# Patient Record
Sex: Female | Born: 1975 | Race: White | Hispanic: No | Smoking: Former smoker
Health system: Southern US, Community
[De-identification: ages and names within clinical notes are randomized; demographics above are authoritative.]

## PROBLEM LIST (undated history)

## (undated) DIAGNOSIS — F419 Anxiety disorder, unspecified: Secondary | ICD-10-CM

## (undated) DIAGNOSIS — N289 Disorder of kidney and ureter, unspecified: Secondary | ICD-10-CM

## (undated) DIAGNOSIS — G43909 Migraine, unspecified, not intractable, without status migrainosus: Secondary | ICD-10-CM

## (undated) HISTORY — PX: OTHER SURGICAL HISTORY: SHX169

## (undated) HISTORY — PX: ABDOMINAL HYSTERECTOMY: SHX81

## (undated) HISTORY — PX: CHOLECYSTECTOMY: SHX55

---

## 1998-09-05 ENCOUNTER — Other Ambulatory Visit: Admission: RE | Admit: 1998-09-05 | Discharge: 1998-09-05 | Payer: Self-pay | Admitting: *Deleted

## 1998-11-05 ENCOUNTER — Inpatient Hospital Stay (HOSPITAL_COMMUNITY): Admission: AD | Admit: 1998-11-05 | Discharge: 1998-11-05 | Payer: Self-pay | Admitting: *Deleted

## 1998-11-14 ENCOUNTER — Inpatient Hospital Stay (HOSPITAL_COMMUNITY): Admission: AD | Admit: 1998-11-14 | Discharge: 1998-11-17 | Payer: Self-pay | Admitting: Obstetrics and Gynecology

## 1999-05-09 ENCOUNTER — Emergency Department (HOSPITAL_COMMUNITY): Admission: EM | Admit: 1999-05-09 | Discharge: 1999-05-09 | Payer: Self-pay | Admitting: *Deleted

## 1999-09-11 ENCOUNTER — Other Ambulatory Visit: Admission: RE | Admit: 1999-09-11 | Discharge: 1999-09-11 | Payer: Self-pay | Admitting: *Deleted

## 1999-11-18 ENCOUNTER — Ambulatory Visit (HOSPITAL_BASED_OUTPATIENT_CLINIC_OR_DEPARTMENT_OTHER): Admission: RE | Admit: 1999-11-18 | Discharge: 1999-11-19 | Payer: Self-pay | Admitting: Orthopedic Surgery

## 2000-08-31 ENCOUNTER — Other Ambulatory Visit: Admission: RE | Admit: 2000-08-31 | Discharge: 2000-08-31 | Payer: Self-pay | Admitting: Obstetrics and Gynecology

## 2001-08-12 ENCOUNTER — Other Ambulatory Visit: Admission: RE | Admit: 2001-08-12 | Discharge: 2001-08-12 | Payer: Self-pay | Admitting: Obstetrics and Gynecology

## 2001-09-01 ENCOUNTER — Ambulatory Visit (HOSPITAL_COMMUNITY): Admission: RE | Admit: 2001-09-01 | Discharge: 2001-09-01 | Payer: Self-pay | Admitting: Obstetrics and Gynecology

## 2001-09-01 ENCOUNTER — Encounter: Payer: Self-pay | Admitting: Obstetrics and Gynecology

## 2001-10-12 ENCOUNTER — Encounter (INDEPENDENT_AMBULATORY_CARE_PROVIDER_SITE_OTHER): Payer: Self-pay

## 2001-10-12 ENCOUNTER — Ambulatory Visit (HOSPITAL_COMMUNITY): Admission: RE | Admit: 2001-10-12 | Discharge: 2001-10-12 | Payer: Self-pay | Admitting: Obstetrics and Gynecology

## 2002-09-14 ENCOUNTER — Other Ambulatory Visit: Admission: RE | Admit: 2002-09-14 | Discharge: 2002-09-14 | Payer: Self-pay | Admitting: Obstetrics and Gynecology

## 2002-09-30 ENCOUNTER — Inpatient Hospital Stay (HOSPITAL_COMMUNITY): Admission: AD | Admit: 2002-09-30 | Discharge: 2002-09-30 | Payer: Self-pay | Admitting: Obstetrics and Gynecology

## 2002-11-08 ENCOUNTER — Inpatient Hospital Stay (HOSPITAL_COMMUNITY): Admission: AD | Admit: 2002-11-08 | Discharge: 2002-11-08 | Payer: Self-pay | Admitting: Obstetrics and Gynecology

## 2002-11-23 ENCOUNTER — Encounter: Payer: Self-pay | Admitting: Obstetrics and Gynecology

## 2002-11-23 ENCOUNTER — Ambulatory Visit (HOSPITAL_COMMUNITY): Admission: RE | Admit: 2002-11-23 | Discharge: 2002-11-23 | Payer: Self-pay | Admitting: Obstetrics and Gynecology

## 2002-12-07 ENCOUNTER — Ambulatory Visit (HOSPITAL_COMMUNITY): Admission: RE | Admit: 2002-12-07 | Discharge: 2002-12-07 | Payer: Self-pay | Admitting: Obstetrics and Gynecology

## 2002-12-07 ENCOUNTER — Encounter: Payer: Self-pay | Admitting: Obstetrics and Gynecology

## 2003-03-02 ENCOUNTER — Observation Stay (HOSPITAL_COMMUNITY): Admission: AD | Admit: 2003-03-02 | Discharge: 2003-03-03 | Payer: Self-pay | Admitting: Obstetrics and Gynecology

## 2003-03-02 ENCOUNTER — Encounter: Payer: Self-pay | Admitting: Obstetrics and Gynecology

## 2003-03-20 ENCOUNTER — Inpatient Hospital Stay (HOSPITAL_COMMUNITY): Admission: AD | Admit: 2003-03-20 | Discharge: 2003-03-20 | Payer: Self-pay | Admitting: Obstetrics and Gynecology

## 2003-03-24 ENCOUNTER — Inpatient Hospital Stay (HOSPITAL_COMMUNITY): Admission: AD | Admit: 2003-03-24 | Discharge: 2003-03-24 | Payer: Self-pay | Admitting: Obstetrics and Gynecology

## 2003-04-06 ENCOUNTER — Inpatient Hospital Stay (HOSPITAL_COMMUNITY): Admission: AD | Admit: 2003-04-06 | Discharge: 2003-04-09 | Payer: Self-pay | Admitting: Obstetrics and Gynecology

## 2003-05-29 ENCOUNTER — Ambulatory Visit (HOSPITAL_COMMUNITY): Admission: RE | Admit: 2003-05-29 | Discharge: 2003-05-29 | Payer: Self-pay | Admitting: Obstetrics and Gynecology

## 2003-10-23 ENCOUNTER — Other Ambulatory Visit: Admission: RE | Admit: 2003-10-23 | Discharge: 2003-10-23 | Payer: Self-pay | Admitting: Obstetrics and Gynecology

## 2004-04-12 ENCOUNTER — Ambulatory Visit (HOSPITAL_COMMUNITY): Admission: RE | Admit: 2004-04-12 | Discharge: 2004-04-12 | Payer: Self-pay | Admitting: Obstetrics and Gynecology

## 2004-05-19 ENCOUNTER — Inpatient Hospital Stay (HOSPITAL_COMMUNITY): Admission: EM | Admit: 2004-05-19 | Discharge: 2004-05-21 | Payer: Self-pay | Admitting: Emergency Medicine

## 2004-05-20 ENCOUNTER — Encounter (INDEPENDENT_AMBULATORY_CARE_PROVIDER_SITE_OTHER): Payer: Self-pay | Admitting: *Deleted

## 2004-12-17 ENCOUNTER — Other Ambulatory Visit: Admission: RE | Admit: 2004-12-17 | Discharge: 2004-12-17 | Payer: Self-pay | Admitting: Obstetrics and Gynecology

## 2004-12-25 ENCOUNTER — Ambulatory Visit (HOSPITAL_COMMUNITY): Admission: RE | Admit: 2004-12-25 | Discharge: 2004-12-25 | Payer: Self-pay | Admitting: Obstetrics and Gynecology

## 2005-01-06 ENCOUNTER — Ambulatory Visit (HOSPITAL_COMMUNITY): Admission: RE | Admit: 2005-01-06 | Discharge: 2005-01-06 | Payer: Self-pay | Admitting: Obstetrics and Gynecology

## 2005-03-05 ENCOUNTER — Inpatient Hospital Stay (HOSPITAL_COMMUNITY): Admission: RE | Admit: 2005-03-05 | Discharge: 2005-03-08 | Payer: Self-pay | Admitting: Obstetrics and Gynecology

## 2005-03-05 ENCOUNTER — Encounter (INDEPENDENT_AMBULATORY_CARE_PROVIDER_SITE_OTHER): Payer: Self-pay | Admitting: Specialist

## 2007-07-10 ENCOUNTER — Emergency Department (HOSPITAL_COMMUNITY): Admission: EM | Admit: 2007-07-10 | Discharge: 2007-07-10 | Payer: Self-pay | Admitting: Family Medicine

## 2007-07-12 ENCOUNTER — Emergency Department (HOSPITAL_COMMUNITY): Admission: EM | Admit: 2007-07-12 | Discharge: 2007-07-12 | Payer: Self-pay | Admitting: Emergency Medicine

## 2008-08-30 ENCOUNTER — Emergency Department (HOSPITAL_COMMUNITY): Admission: EM | Admit: 2008-08-30 | Discharge: 2008-08-30 | Payer: Self-pay | Admitting: Family Medicine

## 2010-08-04 ENCOUNTER — Encounter: Payer: Self-pay | Admitting: Obstetrics and Gynecology

## 2010-11-29 NOTE — Discharge Summary (Signed)
   NAME:  [name], NAME                    ACCOUNT NO.:  [id number]   MEDICAL RECORD NO.:  [id number]                   PATIENT TYPE:  INP   LOCATION:  9133                                 FACILITY:  [facility]   PHYSICIAN:  Janine Limbo, M.D.            DATE OF BIRTH:  September 22, 1975   DATE OF ADMISSION:  04/06/2003  DATE OF DISCHARGE:  04/09/2003                                 DISCHARGE SUMMARY   ADMISSION DIAGNOSES:  1. Intrauterine pregnancy at term.  2. Early labor.  3. Fetal tachycardia.   PROCEDURE:  Primary low transverse cesarean section.   DISCHARGE DIAGNOSES:  1. Intrauterine pregnancy at term.  2. Early labor.  3. Fetal tachycardia.  4. Failure to descend and persistent OP.   HISTORY OF PRESENT ILLNESS:  [name] is a 35 year old gravida 2, para  1-0-0-1 who presents at 38-3/7 weeks in early labor. Her labor was augmented  with Pitocin. She progressed to complete dilation and then began pushing.  She pushed for 2 hours without change in station beyond a +2. Vertex with  persistent posterior position. The patient declined any operative vaginal  intervention. Therefore, in light of patient's 2 hours of pushing without  progress, fetal heart rate changes, and posterior position, cesarean section  was recommended by Dr. Dierdre Forth and accepted by the patient. She  underwent primary low transverse cesarean section on April 06, 2003.  Time of delivery 2315 by Dr. Dierdre Forth with the birth of an 8 pound 11  ounce female named Sager with Apgar scores of 8 at one minute and 9 at five  minutes. Both mother and infant have been stable in the postoperative period  The patient's hemoglobin on the first postoperative day was 9.6. Her vital  signs have remained stable. Her incision is clean and intact. She has a  Jackson-Pratt drain that has initially drained approximately 50 cc per day  and has gradually decreased to minimal drainage, less than 5 cc at the  present time. She is deemed to be in satisfactory condition for discharge on  this, her third postoperative day.   DISCHARGE INSTRUCTIONS:  Per [facility] handout.   DISCHARGE MEDICATIONS:  1. Motrin 600 mg p.o. q. 6 hours p.r.n. pain.  2. Tylox 1-2 p.o. q. 3-4 hours pain.  3. Ortho Tri-Cyclen for contraception.  4. Prenatal vitamins.   FOLLOW UP:  At [facility] and Gynecologic Services in 6  weeks.    Rica Koyanagi, C.N.M.               Janine Limbo, M.D.   SDM/MEDQ  D:  04/09/2003  T:  04/09/2003  Job:  [id number]

## 2010-11-29 NOTE — Discharge Summary (Signed)
NAMEDARRAH, DREDGE NO.:  [id number]   MEDICAL RECORD NO.:  [id number]          PATIENT TYPE:  INP   LOCATION:  0380                         FACILITY[facility]  [facility]   PHYSICIAN:  Leonie Man, M.D.   DATE OF BIRTH:  Apr 24, 1976   DATE OF ADMISSION:  05/19/2004  DATE OF DISCHARGE:  05/21/2004                                 DISCHARGE SUMMARY   ADMISSION DIAGNOSES:  Subacute cholecystitis and biliary colic.   DISCHARGE DIAGNOSES:  Subacute cholecystitis and biliary colic.   PROCEDURE:  Laparoscopic cholecystectomy with intraoperative cholangiogram.   COMPLICATIONS:  None.   CONDITION ON DISCHARGE:  Improved.   SUMMARY:  Ms. Irigoyen is a 35 year old female who presents with recurrent  episodes of epigastric and right upper quadrant pain associated with nausea  and vomiting. CT and ultrasound scanning showed cholelithiasis. The patient  was scheduled for surgery in December but entered the hospital acutely on  the day of admission because of severe unrelenting abdominal pain and nausea  and vomiting. After admission and pain control, the patient was started on  antibiotics and then taken to the operating room on May 20, 2004 where  she underwent laparoscopic cholecystectomy. Her postoperative course has  been benign with the resumption of diet and her usual activity. At the time  of discharge, she is afebrile and tolerating her usual diet. She is not  having any diarrhea, and her wounds are healing satisfactorily. She will be  followed up in the office in approximately two to three weeks.   DISCHARGE MEDICATIONS:  Vicodin 1 to 2 every 4 hours p.r.n.   ACTIVITY:  As tolerated.   DIET:  Unrestricted. d     Patr   PB/MEDQ  D:  05/21/2004  T:  05/22/2004  Job:  [id number]

## 2010-11-29 NOTE — Discharge Summary (Signed)
NAMEDAILY, DOE NO.:  [id number]   MEDICAL RECORD NO.:  [id number]          PATIENT TYPE:  INP   LOCATION:  9314                          FACILITY:  [facility]   PHYSICIAN:  James A. Ashley Royalty, M.D.DATE OF BIRTH:  07-17-1975   DATE OF ADMISSION:  03/05/2005  DATE OF DISCHARGE:  03/08/2005                                 DISCHARGE SUMMARY   DISCHARGE DIAGNOSES:  1.  Adenomyosis.  2.  Endometriosis.  3.  Left lower quadrant discomfort, probably secondary to #2.   OPERATIONS AND SPECIAL PROCEDURES:  Total abdominal hysterectomy, left  salpingo-oophorectomy.   CONSULTATIONS:  None.   DISCHARGE MEDICATIONS:  1.  Demerol 50 mg.  2.  Motrin 600 mg.   HISTORY AND PHYSICAL:  This is a 35 year old gravida 2, para 2, with known  history of endometriosis, severe dysmenorrhea, and cyclic left lower  quadrant discomfort.  She was admitted for total abdominal hysterectomy with  possible unilateral salpingo-oophorectomy and indicated procedures.  For the  remainder of the History and Physical, please see chart.   HOSPITAL COURSE:  The patient was admitted to [facility]  [place].  Admission laboratory studies were drawn.  On August 23, she was  taken to the operating room and underwent total abdominal hysterectomy with  salpingo-oophorectomy.  The procedure was uncomplicated.  Her postoperative  course was similarly uncomplicated.  She was discharged on the third  postoperative day afebrile and in satisfactory condition.   DISPOSITION:  She will return to [facility] and Obstetrics in 4 to  6 weeks for postoperative evaluation.      James A. Ashley Royalty, M.D.  Electronically Signed     JAM/MEDQ  D:  05/07/2005  T:  05/07/2005  Job:  [id number]

## 2010-11-29 NOTE — H&P (Signed)
[name], [name] NO.:  [id number]   MEDICAL RECORD NO.:  [id number]          PATIENT TYPE:  INP   LOCATION:  0102                         FACILITY:  [facility]   PHYSICIAN:  Abigail Miyamoto, M.D. DATE OF BIRTH:  [birth year]   DATE OF ADMISSION:  05/19/2004  DATE OF DISCHARGE:                                HISTORY & PHYSICAL   CHIEF COMPLAINT:  Abdominal pain, nausea, and vomiting.   HISTORY:  This is a 35 year old female with a known history of gallstones  and symptomatic cholelithiasis, who developed abdominal pain, nausea, and  vomiting today.  This was followed by diarrhea.  This was similar to her  previous attacks of symptomatic cholelithiasis.  She has been seen by Dr.  Leonie Man in our office and was originally scheduled to have a  laparoscopic cholecystectomy this December.  She reports her pain is sharp  and constant.   PAST MEDICAL HISTORY:  Significant for endometriosis.  She has had multiple  surgical procedures and laparoscopy secondary to this.  She has also had a  bilateral tubal ligation.   FAMILY HISTORY:  Negative.   SOCIAL HISTORY:  Negative for alcohol and drug abuse.  It is negative for  smoking.   ALLERGIES:  1.  AMOXICILLIN.  2.  SULFA.   MEDICATIONS:  None.   REVIEW OF SYSTEMS:  Negative for chest pain, dysuria, or shortness of  breath, but is otherwise unremarkable.   PHYSICAL EXAMINATION:  VITAL SIGNS:  Temperature of 98.4, pulse 78,  respiratory rate 18, blood pressure 120/90.  GENERAL:  She is ill and uncomfortable in appearance.  HEENT:  Eyes - she is anicteric.  Pupils were reactive bilaterally.  Ears,  nose, mouth, and throat - her ears and nose are normal.  Hearing is normal.  Oropharynx is clear.  NECK:  Supple with no cervical adenopathy.  There is no thyromegaly.  LUNGS:  Clear to auscultation bilaterally.  Normal respiratory effort.  CARDIOVASCULAR:  Regular rate and rhythm.  There are no murmurs.  There is  no peripheral edema.  ABDOMEN:  Soft.  There is tenderness with guarding of the right upper  quadrant and epigastrium.  There is a well-healed incision at the umbilicus.  There are no hernias.  There is no organomegaly.  EXTREMITIES:  Warm and well perfused.  There is no edema, clubbing, or  cyanosis.  NEUROLOGIC:  Nonfocal.   LABORATORY DATA:  The patient has a CBC and a CMET pending.  Beta hCG is  negative.   IMPRESSION:  This is a patient with symptomatic cholelithiasis.   PLAN:  At this point, the plan will be to admit the patient, check her liver  function tests and white blood count, start her on IV rehydration and  antibiotics, and plan laparoscopic cholecystectomy this admission.  I will  notify Dr. Lurene Shadow in the morning that [name] has been placed in the  hospital.      DB/MEDQ  D:  05/19/2004  T:  05/20/2004  Job:  [id number]

## 2010-11-29 NOTE — H&P (Signed)
NAME:  [name], [name]                    ACCOUNT NO.:  [id number]   MEDICAL RECORD NO.:  [id number]                   PATIENT TYPE:  INP   LOCATION:  9374                                 FACILITY:  [facility]   PHYSICIAN:  Crist Fat. Rivard, M.D.              DATE OF BIRTH:  [birth year]   DATE OF ADMISSION:  03/02/2003  DATE OF DISCHARGE:                                HISTORY & PHYSICAL   [name] is a 35 year old gravida 2, para 1-0-0-1, at 33-3/7 weeks,  who presented from the office for evaluation of midsternal chest pain and  shortness of breath.  She had onset of these symptoms yesterday with initial  shortness of breath, then worsened through the night, progressing to chest  pain today.  She also has a dry and hacking cough.  She denies nausea,  vomiting, cramping, dysuria, or diarrhea.  She has no food association with  these symptoms, no previous history of  these symptoms, no hemoptysis.  The  patient is a nonsmoker.  The patient was evaluated in maternity admissions  with 2 D echo, cardiac enzymes, continuous fetal monitoring, nitroglycerin,  and initial one dose of aspirin.  The patient's shortness of breath symptoms  did persist although the chest pain essentially resolved.  [name],  M.D., at [facility] was consulted.  He recommended the patient be  admitted for 23-hour observation to include a chest x-ray and a repeat of  cardiac enzymes. The patient is therefore admitted to the antenatal unit for  observation.  Pregnancy has been remarkable for:   1. Obesity with current weight 265 pounds, which is the same as her last     visit one week ago.  2. First trimester bleeding.  3. History of preeclampsia.  4. History of migraines.  5. Endometriosis.  6. Medication allergies including DARVOCET, SULFA, and AMOXICILLIN.    PRENATAL LABORATORY DATA:  Blood type is O positive, Rh antibody negative.  VDRL nonreactive.  Rubella titer positive.  Hepatitis B  surface antigen  negative.  Pap was normal.  Glucose challenge was normal x2.  AFP was  normal.  Hemoglobin upon entering the practice was 12.1.  It was within  normal limits at 28 weeks.  The patient had a negative UA today.  CBC today  showed hemoglobin of 11.9, hematocrit of 34.1, white blood cell count of  8.6, and platelets of 210.  The patient also had cardiac enzymes of CK-MB  equal to 40, CK of 1.0, and troponin I of 0.02.  Comprehensive metabolic  panel was within normal limits.  EDC of April 17, 2003, was established by  ultrasound which was done at six weeks.   HISTORY OF PRESENT PREGNANCY:  The patient entered care at approximately six  weeks.  She had fallen at work.  She had an ultrasound around that time for  dating purposes.  She also had a little bit of first trimester bleeding.  She had some side pain in the early part of her pregnancy.  She did have  migraines in the early part of her pregnancy as well, which were treated  with Tylenol No. 3.  She had evaluation in maternity admissions in March for  pain.  She also had a GI virus at 17 weeks and received fluid.  She had an  ultrasound at 26 weeks that showed greater than 75th to 90th percentile.  The rest of her pregnancy was essentially uncomplicated until she had the  onset of chest pain and shortness of breath yesterday.   OBSTETRICAL HISTORY:  In May 2000 she had a vaginal birth of a female infant  that weighed 6 pounds 11 ounces at 39 weeks.  She was in labor five hours.  She was induced secondary to preeclampsia.  She had a vacuum-assisted  vaginal birth.  During that pregnancy she also had anemia.   PAST MEDICAL HISTORY:  She was on Ortho Tri-Cyclen until October 2003.  She  had a diagnostic laparoscopy done in April 2003 by Hal Morales, M.D.,  with endometriosis found on the left ovary and at the cervix.  She reports  the usual childhood illnesses.  She has had questionable irritable bowel  syndrome  symptoms in the past but no true diagnosis.  She has had cystitis  x1.  She has had a history of migraine headaches.   She is sensitive to DARVOCET, which causes nausea and vomiting; SULFA, which  causes hives; and AMOXICILLIN, which causes rash and hives.   She does have a history of irregular cycles.   FAMILY HISTORY:  Her maternal grandmother had heart disease.  Her father has  hypertension.  Her maternal grandmother has diabetes.  Her uncle had renal  disease and was on dialysis.  She does have a family history of breast  cancer.  Paternal grandfather had a stroke.  Paternal grandmother and  paternal grandfather had Alzheimer's.  Her father is a smoker.   PAST SURGICAL HISTORY:  Right ankle surgery in 2001.  She had a diagnostic  laparoscopy with Dr. Pennie Rushing in April 2003.  She did have nausea and  vomiting after ankle surgery.   GENETIC HISTORY:  Remarkable for the patient's family having club foot that  runs in the family.   SOCIAL HISTORY:  The patient is married to the father of the baby.  He is  involved and supportive. His name is Ercilia Bettinger, goes by [employer].  The patient is Caucasian, of the [employer].  She has been followed by  the physician service at [facility].  She denies any alcohol, drug,  or tobacco use during this pregnancy.   PHYSICAL EXAMINATION:  VITAL SIGNS:  Blood pressure is 124/78, pulse is 97-  119, O2 saturations are 95-99% on room air.  The patient is afebrile.  The  patient's weight today was 265 pounds, which was the same as it was on her  last visit.  HEENT:  Within normal limits.  CHEST:  Bilateral breath sounds are clear.  Respiratory rate is 22.  CARDIAC:  No murmur is noted.  ABDOMEN:  Fundal height is approximately 37 cm. Fetus is vertex by  Leopold's.  Abdomen is soft and nontender and is obese.  Fetal monitoring  shows fetal heart rate has been reactive throughout her maternity admissions time with no decelerations.  No  uterine contractions are noted.  PELVIC:  Deferred.  EXTREMITIES:  Deep tendon reflexes are 1+ without  clonus, with a trace edema  noted.   EKG showed sinus tachycardia.  The comment was:  Cannot rule out anterior  infarct, age indeterminate.  Possible inferior infarct, age indeterminate.   ASSESSMENT:  1. Intrauterine pregnancy at 33-3/7 weeks.  2. Chest pain, shortness of breath, with EKG changes.   PLAN:  1. The patient will be admitted to antenatal unit or AICU for 23-hour     observation.  2. Continuous electronic fetal monitoring.  3. Chest x-ray.  4. Repeat cardiac enzyme panel including CK, CK-MB, and troponin I will be     repeated eight hours after the initial labs.  5. Dr. Myrtis Ser will be in to see the patient at some point during her     hospitalization.  6. Dr. Estanislado Pandy has recommended Protonix 40 mg one p.o. daily.  7.     O2 per nasal cannula at 2 L/min. will be given for patient comfort.  8. Head of bed will be elevated for patient comfort.  9. Dr. Estanislado Pandy and Dr. Myrtis Ser will follow.     Renaldo Reel Emilee Hero, C.N.M.                   Crist Fat Rivard, M.D.    Leeanne Mannan  D:  03/02/2003  T:  03/03/2003  Job:  [id number]

## 2010-11-29 NOTE — Op Note (Signed)
[name], SLINGER NO.:  [id number]   MEDICAL RECORD NO.:  [id number]          PATIENT TYPE:  AMB   LOCATION:  SDC                           FACILITY:  [facility]   PHYSICIAN:  James A. Ashley Royalty, M.D.DATE OF BIRTH:  [birth year]   DATE OF PROCEDURE:  01/06/2005  DATE OF DISCHARGE:                                 OPERATIVE REPORT   PREOPERATIVE DIAGNOSIS:  1.  Endometriosis.  2.  Left lower quadrant pain - differential includes endometriosis,      adhesions, primary GI, musculoskeletal.   POSTOPERATIVE DIAGNOSIS:  No evidence of endometriosis or adhesions.   PROCEDURE:  Diagnostic laparoscopy.   SURGEON:  Rudy Jew. Ashley Royalty, M.D.   ANESTHESIA:  General.   ESTIMATED BLOOD LOSS:  Less than 25 mL.   COMPLICATIONS:  None.   PACKS AND DRAINS:  None.   PROCEDURE:  The patient was taken to operating room, placed in dorsosupine  position. After adequate general endotracheal anesthesia was administered,  she was placed in the lithotomy position, prepped and draped in usual manner  for abdominal and vaginal surgery. Posterior weighted retractor was placed  per vagina. The anterior lip of the cervix was grasped with single-tooth  tenaculum. Jarcho uterine manipulator was placed per cervix and held in  place with tenaculum. Bladder was drained with red rubber catheter. The 1.5  cm infraumbilical incision was made in a longitudinal plane. Veress needle  was inserted into the abdominal cavity. It's location verified by  instillation saline in hanging drop techniques. Approximately 3 liters CO2  were instilled at 1 liter per minute to create the pneumoperitoneum. Next a  size 11 disposable laparoscopic trocar was placed into the abdominal cavity.  It's location was visualized by placement of laparoscope. There is no  evidence of any trauma. The pelvis was then thoroughly surveyed. The uterus  was normal size, shape and contour without evidence of any fibroids or  endometriosis. The fallopian tubes were normal size, shape and contour  except for bilateral interruption consistent with previous tubal  sterilization procedure. Left ovary was normal size, shape and contour  without evidence of any cysts or endometriosis. Right ovary was normal size,  shape and contour with approximately 1.5 cm cystic follicle present on it.  There was no evidence of any overt cysts or endometriosis. Anterior and  posterior cul-de-sacs were clean. It should be mentioned that a 5 mm  suprapubic trocar was placed in the left lower quadrant using  transillumination and direct visualization in order to assist in the  performance of the laparoscopy.   At this point the patient was felt to have benefited maximally from the  surgical procedure. Pneumoperitoneum was evacuated. The fascial defects were  closed with 0 Vicryl in interrupted fashion. The skin was closed with 3-0  Monocryl in subcuticular fashion. Vaginal instruments were removed. There  was a small cervical laceration on the left side at the tenaculum site  easily controlled with 2-0 chromic in a figure-of-eight fashion.   The patient tolerated the procedure extremely well and was returned to the  recovery room in good condition.  JAM/MEDQ  D:  01/06/2005  T:  01/06/2005  Job:  [id number]

## 2010-11-29 NOTE — H&P (Signed)
[name], [name] NO.:  [id number]   MEDICAL RECORD NO.:  [id number]          PATIENT TYPE:  INP   LOCATION:  NA                            FACILITY:  [facility]   PHYSICIAN:  James A. Ashley Royalty, M.D.DATE OF BIRTH:  [birth year]   DATE OF ADMISSION:  DATE OF DISCHARGE:                                HISTORY & PHYSICAL   This is a 35 year old, gravida 2, para 2 with a known history of  endometriosis, and severe dysmenorrhea, and cyclic left lower quadrant  discomfort.  She is for total abdominal hysterectomy with possible  unilateral salpingo-oophorectomy or possible bilateral salpingo-oophorectomy  and indicated procedures.   MEDICATIONS:  Ibuprofen, Maxalt, and Lorcet.   PAST MEDICAL HISTORY:   MEDICAL:  Negative.   SURGICAL:  1.  C-section.  2.  Cholecystectomy.  3.  Ankle surgery.  4.  Tubal sterilization procedure.   ALLERGIES:  AMOXICILLIN.   FAMILY HISTORY:  Positive for breast cancer and diabetes.   SOCIAL HISTORY:  The patient denies the use of tobacco or significant  alcohol.   REVIEW OF SYSTEMS:  Noncontributory.   PHYSICAL EXAMINATION:  GENERAL:  A well-developed, well-nourished, pleasant  female, no acute distress.  VITAL SIGNS:  Afebrile, vital signs stable.  SKIN:  Warm and dry without lesions.  CHEST:  Lungs are clear.  CARDIAC:  Regular rate and rhythm.  ABDOMEN:  Soft nontender without masses or organomegaly.  Bowel sounds are  active.  PELVIC:  External genitalia within normal limits.  Vagina and cervix without  gross lesions.  Bimanual examination reveals the uterus to be approximately  8 x 4 x 4-cm.  No adnexal masses are palpable.   IMPRESSION:  1.  Endometriosis.  2.  Left lower quadrant discomfort - debilitating and cyclic, most likely      secondary to endometriosis.   PLAN:  Total abdominal hysterectomy with possible unilateral salpingo-  oophorectomy or possible bilateral salpingo-oophorectomy.  Risks, benefits,  complications, __________  discussed with the patient.  She states she  understands and accepts.  Questions invited and answered.           ______________________________  Rudy Jew Ashley Royalty, M.D.     JAM/MEDQ  D:  03/04/2005  T:  03/04/2005  Job:  [id number]

## 2010-11-29 NOTE — Op Note (Signed)
NAME:  [name], [name]                    ACCOUNT NO.:  [id number]   MEDICAL RECORD NO.:  [id number]                   PATIENT TYPE:  AMB   LOCATION:  SDC                                  F[facility]ILITY:  [facility]   PHYSICIAN:  Hal Morales, M.D.             DATE OF BIRTH:  1976-03-22   DATE OF PROCEDURE:  05/29/2003  DATE OF DISCHARGE:                                 OPERATIVE REPORT   PREOPERATIVE DIAGNOSIS:  1. Desire for surgical sterilization.  2. History of endometriosis.   POSTOPERATIVE DIAGNOSES:  1. Desire for surgical sterilization.  2. History of endometriosis.  3. Right ovarian cyst.   OPERATION:  Laparoscopic tubal cautery, right ovarian cyst aspiration.   SURGEON:  Hal Morales, M.D.   ANESTHESIA:  General orotracheal.   ESTIMATED BLOOD LOSS:  Less than 25 cc.   COMPLICATIONS:  None.   FINDINGS:  The uterus and tubes were normal.  The left ovary was within  normal limits.  The right ovary was enlarged, to approximately 4 cm; with a  2 cm simple cyst containing clear, yellow fluid.  There was no apparent  evidence of endometriosis at this time.   DESCRIPTION OF PROCEDURE:  The patient was taken to the operating room after  appropriate identification, and placed on the operating room table.  After  the attainment of adequate general anesthesia, she was placed in the  modified lithotomy position.  The abdomen, perineum and the vagina were  prepped with multiple layers of Betadine.  Red Robinson catheter was used to  empty the bladder.  A Hulka tenaculum was placed on the anterior cervix.  The abdomen was draped as a sterile field.  Subumbilical and left suprapubic  injections with 0.25% Marcaine, for a total of 10 cc were undertaken.  A  subumbilical incision was made and a Veress cannula placed through that  incision into the peritoneal cavity.  A pneumoperitoneum was created with  4.5 L of CO2.  The Veress cannula was removed and the laparoscopic  trocar  placed through that incision into the peritoneal cavity.  The above-noted  findings were made.   A suprapubic incision to the left of midline was made and a laparoscopic  probe trocar placed through that incision into the peritoneal cavity, under  direct visualization.  The right ovarian cyst was then elevated and  aspirated with approximately 5 cc of clear yellow fluid removed.  The right  fallopian tube was identified, followed to its fimbriated end, then grasped  at the isthmic portion and cauterized into adjacent areas.  A similar  procedure was carried out on the opposite side.  Hemostasis was noted to be  adequate.  All instruments were then removed from the peritoneal cavity,  under direct visualization as the CO2 was allowed to escape.  A fascial  figure-of-eight suture of 0 Vicryl was placed in the subumbilical incision,  and tied down.  The subumbilical skin incision  and the suprapubic skin  incision were closed with subcuticular sutures of 3-0 Vicryl.  Sterile  dressings were applied.  The Hulka tenaculum was removed. The patient was  awakened from general anesthesia and taken to the recovery room in  satisfactory condition; having tolerated the procedure well.  Sponge and  instrument counts were correct.   It should be noted that during the procedures several equipment changes were  necessary before a functioning cautery mechanism was identified.  Once the  appropriately functioning mechanism was identified, the remainder of the  procedure was completed.                                               Hal Morales, M.D.    VPH/MEDQ  D:  05/29/2003  T:  05/29/2003  Job:  [id number]

## 2010-11-29 NOTE — Op Note (Signed)
[place]  Patient:    [name], [name]                   MRN: [id number] Proc. Date: 11/18/99 Adm. Date:  [id number] Disc. Date: [id number] Attending:  Burnard Bunting                           Operative Report  PREOPERATIVE DIAGNOSIS:  Right ankle lateral instability.  POSTOPERATIVE DIAGNOSIS:  Right ankle lateral instability.  PROCEDURE:  Right ankle lateral reconstruction, ________-type.  SURGEON:  Cammy Copa, M.D.  ANESTHESIA:  General endotracheal.  ESTIMATED BLOOD LOSS:  Minimal.  DRAINS:  None.  INDICATIONS:  The patient is a 35 year old patient who has had a two year history of right ankle lateral instability despite conservative measures.  She has a talar tilt of 17 degrees under _______ radiograph.  She presents for right lateral ankle reconstruction.  DESCRIPTION OF PROCEDURE:  The patient was brought to the operating room where general endotracheal anesthesia was induced.  Preoperative IV antibiotics were administered.  The patients foot was prepped with DuraPrep solution, and draped in a sterile manner.  The operative field was covered with an [place]. The leg was elevated and exsanguinated with the Esmarch wrap.  The tourniquet was inflated.  An inverted-J incision was made beginning at the flare of the fibula, and extended down across the fibular tip.  The skin and subcutaneous tissue were sharply divided.  The retinaculum was identified for later reinforcement of the repair.  The peroneal tendons were then identified at the posterior edge of the lateral malleolus.  The capsule and ligaments were then incised with the 15 blade.  This started anterior to the peroneal tendon and then extended around the tip of the fibula and around the anterior edge to the flare.  A 5 mm cuff of tissue was left on the fibula.  About eight 2-0 Ethibond sutures were placed in the Mason-Allen style in the distal flap of the capsule and  ligaments.  The fibular ligament was also identified. These sutures were then brought through the proximal aspect of the cuff of tissue on the distal fibula.  The sutures were pulled taut and the gravity test on the foot demonstrated less passive downward sloping of the ankle compared to preoperative examination.  Initial examination was made after the foot was prepped and draped.  Just using gravity, the ankle was noted to have visible talar tilt.  With holding the sutures tight, the calcaneus was noted to point straight in line with the leg against gravity.  The 2-0 Ethibond sutures were then tied in this position.  The superior leaf was then closed over the inferior leaf, and reinforced with three 2-0 Ethibond sutures.  At this point, the retinaculum was mobilized and shifted over the fibular tip for further reinforcement of the repair.  This retinaculum was held in place using 2-0 chromic suture.  The incision was irrigated.  Gravity test after the sutures were tied demonstrated no visible talar tilt with the calcaneus in a straight line with the distal tibia.  The incision was then irrigated and the edges were infiltrated with Marcaine.  The skin was closed using interrupted far-near and near-far 3-0 Prolene suture.  The splint was applied with the foot in 90 degrees of dorsiflexion, adduction and pronation.  The tourniquet was released after the dressings were on.  Total tourniquet time was 1 hour and 53  minutes.  The patient was transferred to the recovery room in stable condition. DD:  11/18/99 TD:  11/20/99 Job: [id number]

## 2010-11-29 NOTE — Consult Note (Signed)
NAME:  [name], [name]                    ACCOUNT NO.:  [id number]   MEDICAL RECORD NO.:  [id number]                   PATIENT TYPE:  INP   LOCATION:  9374                                 [facility]ACILITY[facility]  [facility]   PHYSICIAN:  [name], M.D.                  DATE OF BIRTH:  07/19/1975   DATE OF CONSULTATION:  03/02/2003  DATE OF DISCHARGE:                                   CONSULTATION   This patient is seen in consultation at [facility].  She is a very  pleasant 35 year old female who is [redacted] weeks pregnant with her second child.  She had toxemia with pregnancy with her first child.  This pregnancy has  been fairly stable.  She has had some increasing shortness of breath with  exertion over several weeks and in the past day or two she has noted the  shortness of breath when trying to lie down or with minimal exertion.  She  then had some stabbing pain in her chest, and she came to the emergency  room.  Her EKG showed Q-waves in leads II, III, and aVF, and we are asked to  see her for further evaluation.   PAST MEDICAL HISTORY:  Other medical problems:  See the complete list below.   ALLERGIES:  SULFA, AMOXICILLIN, and DARVOCET.   MEDICATIONS:  1. Macrobid.  2. Vitamins.  3. Tylenol for headache.   SOCIAL HISTORY:  The patient does not smoke or drink.   FAMILY HISTORY:  There is no strong family history of coronary disease.   REVIEW OF SYSTEMS:  The patient has had no fevers or chills and no major  headaches.  She has had shortness of breath and chest pain as described.  She had had some mild edema.  She had a recent urinary tract infection.  She  has had no other significant problems, and the remainder of her review of  systems is negative.   PHYSICAL EXAMINATION:  VITAL SIGNS:  Temperature is 98.1.  Pulse is 97.  Blood pressure is 125/78.  HEENT:  No marked abnormalities.  NECK:  Without bruits.  CARDIAC:  S1 with an S2 but no clicks or significant murmurs.  CHEST:   Her lungs are clear.  ABDOMEN:  Obese abdomen.  RECTAL:  Of course, deferred.  EXTREMITIES:  There is mild peripheral edema.  NEUROLOGIC:  Grossly intact.   Chest x-ray is read as normal by radiology.  EKG shows inferior Q-waves.  Her first troponin is normal.  A two-dimensional echo was done, and I have  read it.  There is an ejection fraction in the 60% range.  There is a  trivial pericardial effusion of no clinical significance.  The apex is not  well-seen, but I do not see any definite focal wall motion abnormalities.  In particular, the inferior wall appears to move appropriately.   PROBLEMS:  1. History of a recent urinary tract infection,  treated.  2. Preeclampsia with her first pregnancy.  3. Rashes to SULFA, AMOXICILLIN, and DARVOCET.  4. *Increasing shortness of breath over several weeks with worsening today.  5. Sharp chest pain today.   At this point the etiology of her symptoms is not clear to me.  It does not  appear that she has had any cardiac injury.  The cardiac enzymes need to be  checked.  Consideration could be given to some diuresis, but this will have  to be approved by the [facility] team.  The right heart appears to move adequately.  I do not think that there is evidence of a pulmonary embolus.  This could be  considered.  Overall cardiac status is stable.                                                 [name], M.D.    Cleotis Lema  D:  03/02/2003  T:  03/03/2003  Job:  [id number]

## 2010-11-29 NOTE — H&P (Signed)
NAME:  [name], [name]                    ACCOUNT NO.:  [id number]   MEDICAL RECORD NO.:  [id number]                   PATIENT TYPE:  INP   LOCATION:  9174                                 [facility]   PHYSICIAN:  Hal Morales, M.D.             DATE OF BIRTH:  03-03-1976   DATE OF ADMISSION:  04/06/2003  DATE OF DISCHARGE:                                HISTORY & PHYSICAL   HISTORY OF PRESENT ILLNESS:  [name] is a 35 year old, married, white  female, gravida 2, para 1-0-0-1, at 38-3/7 weeks, who presents complaining  of increasing frequency and intensity of uterine contractions throughout the  night.  She denies any leaking or vaginal bleeding.  She denies any nausea,  vomiting, headaches, or visual disturbances.  Her pregnancy has been  followed at [facility] by the M.D. service and has been at risk  for:  1.  History of first trimester bleeding.  2.  Obesity.  3.  History of  preeclampsia with her first pregnancy.  4.  History of migraines.  5.  History of endometriosis.  Her group B Streptococcus is negative with this  pregnancy.   OBSTETRICAL/GYNECOLOGIC HISTORY:  She is a gravida 2, para 1-0-0-1, who  delivered a viable female infant in May of 2000 who weighed 6 pounds 11 ounces  at [redacted] weeks gestation following an induction of labor secondary to  preeclampsia.  She did require vacuum-assisted delivery.  Her last menstrual  period for this pregnancy is July 07, 2003, giving her an [facility] of  April 12, 2003, but the [facility] is established by ultrasound of April 17, 2003, secondary to irregular cycles.  Her other GYN history is essentially  noncontributory.   ALLERGIES:  She is allergic to DARVOCET.  It gives her nausea and vomiting.  SULFA gives her hives.  AMOXICILLIN gives her rash and hives.   PAST MEDICAL HISTORY:  She reports having had the usual childhood diseases.  She has a history of possible irritable bowel syndrome and occasional  urinary tract infection.   PAST SURGICAL HISTORY:  Her only surgery was right ankle surgery in 2001 and  a diagnostic laparoscopy by Dr. Pennie Rushing in April of 2003.   FAMILY HISTORY:  Significant for maternal grandmother with heart disease,  father with hypertension, paternal grandmother with diabetes, an uncle with  dialysis, breast cancer in her family, and paternal grandfather with a  stroke.   GENETIC HISTORY:  Essentially negative, though club foot runs in her family.   SOCIAL HISTORY:  She is married to [employer], who is involved and  supportive.  They are both employed full-time.  They are of the [employer].  They deny any illicit drug use, alcohol, or smoking with this  pregnancy.   PRENATAL LABORATORY DATA:  Her blood type is O+.  Her antibody screen is  negative.  Syphilis is nonreactive.  Rubella is positive.  Hepatitis B  surface antigen is negative.  Cystic fibrosis was declined.  GC and  chlamydia are negative.  Pap was within normal limits.  Her one-hour Glucola  was within normal range.  Her 36-week beta Streptococcus was negative.   PHYSICAL EXAMINATION:  VITAL SIGNS:  Stable.  She is afebrile.  HEENT:  Grossly within normal limits.  HEART:  Regular rate and rhythm.  CHEST:  Clear.  BREASTS:  Soft and nontender.  ABDOMEN:  Gravid with uterine contractions that are mild and irregular every  two to four minutes.  Her fetal heart rate is reactive and reassuring.  PELVIC:  2-3 cm, 70%, vertex -3, and ballottable per Hal Morales, M.D.  EXTREMITIES:  Within normal limits.   ASSESSMENT:  1. Intrauterine pregnancy at term.  2. Early labor.  3. Negative group B Streptococcus .   PLAN:  Her plan per Hal Morales, M.D., is to admit to labor and  delivery.     Concha Pyo. Duplantis, C.N.M.              Hal Morales, M.D.    SJD/MEDQ  D:  04/06/2003  T:  04/06/2003  Job:  [id number]

## 2010-11-29 NOTE — H&P (Signed)
[name], SLEE NO.:  [id number]   MEDICAL RECORD NO.:  [id number]          PATIENT TYPE:  AMB   LOCATION:                                [facility]   PHYSICIAN:  James A. Ashley Royalty, M.D.DATE OF BIRTH:  01-31-1976   DATE OF ADMISSION:  01/06/2005  DATE OF DISCHARGE:                                HISTORY & PHYSICAL   This is a 35 year old gravida 2, para 2 who presented to me January 06, 2005  complaining of left lower quadrant discomfort.  She previously had been a  patient of another OB/GYN practice in town for several years.  She has a  long history of significant left lower quadrant discomfort during her  periods.  Periods are currently every 20-30 days, five to eight-day flow.  She has been treated with oral contraceptives including a continuous regimen  which produced amenorrhea, but without substantial relief.  She has also had  a procedure which may have included a laparoscopy which definitely diagnosed  endometriosis.  She has not yet been treated with Lupron but she does not  want to use that product.  She finds that her symptoms are quite  debilitating and states an interest in possibly pursuing a hysterectomy.  She is already status post tubal sterilization procedure November 2004.   MEDICATIONS:  Ibuprofen, Maxalt, Vicodin.   PAST MEDICAL HISTORY:  Migraine.   PAST SURGICAL HISTORY:  1.  Cesarean section.  2.  Cholecystectomy.  3.  Ankle surgery.  4.  BTSP as above.   ALLERGIES:  AMOXICILLIN.   FAMILY HISTORY:  Positive for diabetes and breast cancer.   SOCIAL HISTORY:  Patient denies use of tobacco or significant alcohol.   REVIEW OF SYSTEMS:  Noncontributory.   PHYSICAL EXAMINATION:  GENERAL:  Well-developed, well-nourished, pleasant  white female in no acute distress.  VITAL SIGNS:  Afebrile.  Vital signs stable.  SKIN:  Warm and dry without lesions.  LYMPH:  No supraclavicular, cervical, or inguinal adenopathy.  HEENT:   Normocephalic.  NECK:  Supple without thyromegaly.  CHEST:  Lungs are clear.  CARDIAC:  Regular rate and rhythm without murmurs, rubs, or gallops.  BREASTS:  Soft without palpable mass, discharge, retraction, or adenopathy.  ABDOMEN:  Soft and with noticeable direct tenderness in the lower quadrants  bilaterally left greater than right.  There was no significant rebound  tenderness or guarding.  Bowel sounds are active.  MUSCULOSKELETAL:  Full range of motion without edema, cyanosis, or CVA  tenderness.  PELVIC:  External genitalia within normal limits.  Vagina and cervix are  without gross lesions.  Bimanual examination reveals the uterus to be  approximately 8 x 4 x 4 cm, mid position and no adnexal masses are palpable.   IMPRESSION:  1.  Endometriosis.  2.  Left lower quadrant discomfort, debilitating and cyclic, most likely      secondary to endometriosis.  Differential also includes adhesions,      primary gastrointestinal, musculoskeletal, etc.  3.  Migraine headaches.  4.  Status post BTSP.  5.  Status post cesarean section, cholecystectomy, orthopedic surgery on the  ankle.   PLAN:  Diagnostic/operative laparoscopy.  Risks, benefits, complications,  and alternatives fully discussed with the patient.  Possibility of  unilateral salpingo-oophorectomy discussed and accepted.  Possibility of  exploratory laparotomy discussed and accepted.  Questions invited and  answered.       JAM/MEDQ  D:  01/06/2005  T:  01/06/2005  Job:  [id number]

## 2010-11-29 NOTE — Op Note (Signed)
[name], [name] NO.:  [id number]   MEDICAL RECORD NO.:  [id number]          PATIENT TYPE:  INP   LOCATION:  9399                          [facility]ILITY[facility]  [facility]   PHYSICIAN:  James A. Ashley Royalty, M.D.DATE OF BIRTH:  [birth year]   DATE OF PROCEDURE:  03/05/2005  DATE OF DISCHARGE:                                 OPERATIVE REPORT   PREOPERATIVE DIAGNOSIS:  Endometriosis.  Pelvic pain.   POSTOPERATIVE DIAGNOSIS:  Endometriosis.  Pelvic pain. Path pending.   PROCEDURE:  1.  Total abdominal hysterectomy  2.  Left salpingo-oophorectomy.   SURGEON:  Rudy Jew. Ashley Royalty, M.D.   ASSISTANT:  Bing Neighbors. Sydnee Cabal, M.D.   ANESTHESIA:  General.   ESTIMATED BLOOD LOSS:  200 mL.   COMPLICATIONS:  None.   PACKS AND DRAINS:  Foley.   SPONGE AND INSTRUMENT COUNT:  Reported as correct x2.   PROCEDURE:  The patient was taken to operating room, placed in dorsosupine  position. After general anesthesia was administered she was placed frog leg  position and prepped and draped in the usual manner for abdominal surgery.  The vagina was prepped as well. Foley catheter was placed. After all drapes  were in place, a Pfannenstiel incision was made down the level of the fascia  which was nicked with knife and incised transversely with Mayo scissors. The  underlying rectus muscles separated from the fascia using sharp and blunt  dissection. Rectus muscles were separated in the midline exposing peritoneum  which was elevated with hemostats and entered atraumatically with Metzenbaum  scissors. Incision was extended longitudinally. The upper abdomen was  explored. The kidneys were present bilaterally. There was no periaortic  adenopathy. Liver surface was smooth. The peritoneum was smooth and  glistening. Balfour retractor was placed and the upper abdomen packed off.  The uterus was normal size, shape and contour without evidence of any  fibroids. Left ovary was slightly enlarged and  appeared to contain some  pigmented lesions consistent with endometriosis. The right ovary and  fallopian tube appeared normal.   The round ligaments were clamped, cut and secured with #1 Vicryl.  Vesicouterine fold was incised and the bladder sharply and bluntly dissected  inferiorly. The right fallopian tube, utero-ovarian anastomosis was then  clamped, cut and doubly secured with #1 Vicryl. Same was accomplished on the  left side initially. The uterine vessels were skeletonized. The uterine  vessels were then clamped, cut, and bilaterally secured with #1 Vicryl.  Next, straight Heaney Valentines were used to come down on either side of  the cervix to secure the cardinal ligaments. Each pedicle was secured with  #1 Vicryl. Uterosacral ligaments were separately clamped, cut and secured  with #1 Vicryl. At the level of vagina, the vagina was entered and the  cervix and uterus excised with Satinsky scissors. Richardson angle sutures  were placed at the vaginal angles and tied securely. Next a running locking  circumferential suture of 2-0 Vicryl was placed on the vaginal cuff. One  additional figure-of-eight suture was placed in the midline in order to  close the vaginal cuff. Two additional small 2-0 Vicryl sutures were  placed  in interrupted fashion in order to obtain hemostasis. Hemostasis was noted.  The adnexal pedicles were inspected. In context with the patient's history  of pelvic pain, mostly concentrated on the left side, the decision was made  to go head and remove the left ovary and fallopian tube. Hence, the  infundibulopelvic ligament was clamped and left ovary and tube removed and  submitted to pathology for histologic studies. The pedicle was doubly  secured with #1 chromic catgut. At this point all pedicles were inspected  and found to be hemostatic. Copious irrigation was accomplished. Hemostasis  was once again noted. At this point the patient was felt to have benefited   maximally from the surgical procedure.   The packs were removed as well as the retractors. The peritoneum was then  closed with 3-0 Vicryl in a running fashion. The fascia was closed with 0  Vicryl in a running fashion. The skin was closed with staples. The patient  tolerated procedure extremely well and was returned to recovery room in good  condition. At the conclusion of the procedure, the urine was clear and  copious.           ______________________________  Rudy Jew Ashley Royalty, M.D.     JAM/MEDQ  D:  03/05/2005  T:  03/05/2005  Job:  [id number]

## 2010-11-29 NOTE — Op Note (Signed)
[place]  Patient:    [name], [name]                   MRN: [id number] Proc. Date: 11/18/99 Adm. Date:  [id number] Disc. Date: [id number] Attending:  Burnard Bunting                           Operative Report  PREOPERATIVE DIAGNOSIS:  Right ankle instability.  POSTOPERATIVE DIAGNOSIS:  Right ankle instability. DD:  11/25/99 TD:  11/25/99 Job: [id number]

## 2010-11-29 NOTE — Discharge Summary (Signed)
NAME:  [name], [name]                    ACCOUNT NO.:  [id number]   MEDICAL RECORD NO.:  [id number]                   PATIENT TYPE:  INP   LOCATION:  9374                                 FACILITY:  [facility]   PHYSICIAN:  Crist Fat. Rivard, M.D.              DATE OF BIRTH:  06/16/1976   DATE OF ADMISSION:  03/02/2003  DATE OF DISCHARGE:  03/03/2003                           DISCHARGE SUMMARY - REFERRING   DISCHARGE DIAGNOSES:  1. Admitted with atypical chest pain; negative cardiac enzymes, Q waves in     leads 2, 3 and aVF on electrocardiogram, normal echocardiogram.  2. Persistent dyspnea; both chest pain and dyspnea are much better the last     24 hours.   SECONDARY DIAGNOSES:  1. Pregnancy, 33.5 weeks, second child; she is a gravida 2, para 1.  2. History of recent urinary tract infection, having finished Macrobid     therapy.  3. Preeclampsia symptoms, first pregnancy.   PROCEDURE:  1. Two-dimensional echocardiogram August 19th; study showed an ejection     fraction of 60%, unable to access wall motion abnormalities if any.     Overall left ventricular function looked good.  Trivial posterior     pericardial effusion, aortic valve, mitral valve, pulmonic valve and     tricuspid valve are fine.   LABORATORY STUDIES:  Complete blood count August 19th:  White cells 8.6,  hemoglobin 11.9, hematocrit 34.1 and platelets 210,000.  Serum electrolytes  August 19th;  sodium 137, potassium 3.5, chloride 105, bicarbonate 25, BUN  3, creatinine 0.5, and glucose 91.  Urinalysis on August 20th was negative.  Liver function tests; alk phosphatase 99, SGOT 15 and SGPT 19.   DISCHARGE DISPOSITION:  After 23 hours of observation the patient is ready  for discharge August 20th.  She has had no further chest pain during this  hospitalization.  She has been started on aspirin and Protonix therapy.  Her  electrocardiogram on August 20th showed a sinus rhythm, rate of 99, there  are Q waves in  2, 3 and aVF; however, this must be due to body habitus since  cardiac enzymes have been negative this study.  She had serial troponin  studies starting August 19th; they were 0.02, then 0.02 and then 0.01.  CKs  on the first round were 40 and CK MB 1.0.   BRIEF HISTORY:  Ms. [name] is a 35 year old female, [redacted] weeks  pregnant with her second child.  She had toxemia with pregnancy with her  first child.  This pregnancy has been fairly stable.  She has some  increasing shortness of breath with exertion over the past several weeks,  but in the past day or two she has noted shortness of breath when trying to  lie down or with minimal exertion.  She then has some stabbing pain in her  chest, which waxes and wanes, and she came to the emergency room.  Electrocardiogram shows  Q waves in 2, 3 and aVF, and we were asked to see  her for further evaluation.  Evaluation will consist of 2-D echocardiogram,  serial cardiac enzymes and follow up electrocardiogram on the morning of  August 20th.  She also had a chest x-ray, which showed that her lungs were  clear with no active process.   DISCHARGE MEDICATIONS:  1. Prenatal vitamin.  2. Tylenol as needed for headache.  3. Protonix 40 mg daily.   FOLLOW UP:  The patient will have follow up visit with Dr. Myrtis Ser on March 22, 2003 at 11:45 in the morning and it was recommended that she sleep with  the head of the bed elevated.        Maple Mirza, P.A.                    Crist Fat Rivard, M.D.    Whitman Hero  D:  03/03/2003  T:  03/03/2003  Job:  [id number]

## 2010-11-29 NOTE — Discharge Summary (Signed)
NAME:  [name], [name]                    ACCOUNT NO.:  [id number]   MEDICAL RECORD NO.:  [id number]                   PATIENT TYPE:  INP   LOCATION:  9374                                 FACILITY:  [facility]   PHYSICIAN:  Osborn Coho, M.D.                DATE OF BIRTH:  [birth year]   DATE OF ADMISSION:  03/02/2003  DATE OF DISCHARGE:  03/03/2003                                 DISCHARGE SUMMARY   ADMISSION DIAGNOSES:  1. Intrauterine pregnancy at 43 and 3/7ths weeks.  2. Shortness of breath.  3. Chest pain.   DISCHARGE DIAGNOSES:  1. Intrauterine pregnancy at 22 and 4/7ths weeks.  2. Resolved chest pain.  3. Resolved shortness of breath.   PROCEDURES THIS ADMISSION:  None.   HOSPITAL COURSE:  [name] is a 35 year old white female, gravida 2,  para 1-0-0-1, at 16 and 3/7ths weeks who presented from the [facility]  OB/GYN office for evaluation secondary to midsternal chest pain and  shortness of breath that started on March 01, 2003, and increased in  intensity through March 02, 2003.  She also reports a dry hacking cough,  but has no nausea, vomiting, cramping, dysuria, or diarrhea.  No previous  history of similar symptoms.  She was evaluated with an EKG which showed  sinus tachycardia and had a comment that could not rule out anterior  infarction, at which point a cardiologist was requested to evaluate the  patient, and the patient had a full cardiac work-up that has been all  negative.  A 2-D echo was normal.  She had an ejection fraction of 60%.  Cardiac enzymes in a series of three were all normal.  This morning, on  March 03, 2003, her symptoms are resolved, she has no further chest pain or  shortness of breath, and is requesting to be able to go home.  She is  discharged from cardiology's perspective and has a follow up appointment  with them already scheduled.  Her fetal heart rate is reactive and  reassuring.  She has no regular uterine contractions  noted, and she is  deemed ready for discharge today.  Her pain did seem to resolve primarily  after taking Protonix.   DISCHARGE LABORATORIES:  Her SGOT is 15, SGPT is 19, WBC count is 8.6,  hemoglobin 11.0, platelet counts 210.  Her CK-MB's are all within normal  limits.  Her urine was negative.   DISCHARGE MEDICATIONS:  1. Prenatal vitamins daily.  2. Protonix 40 mg daily.    DISCHARGE FOLLOW UP:  1. March 06, 2003 with Dr. Stefano Gaul as the [facility] OB[facility] office.  2. Dr. Myrtis Ser of cardiology at [facility] on March 22, 2003 at     11:45 a.m.  She also is to call them if she experiences further problems     at [phone].     Concha Pyo. Duplantis, C.N.M.  Osborn Coho, M.D.    SJD/MEDQ  D:  03/03/2003  T:  03/03/2003  Job:  [id number]   cc:   [name], M.D.   [facility]

## 2010-11-29 NOTE — Op Note (Signed)
NAMELASHON, BERINGER NO.:  [id number]   MEDICAL RECORD NO.:  [id number]          PATIENT TYPE:  INP   LOCATION:  0380                         FACILITY:  [facility]   PHYSICIAN:  Leonie Man, M.D.   DATE OF BIRTH:  06-15-1976   DATE OF PROCEDURE:  05/20/2004  DATE OF DISCHARGE:                                 OPERATIVE REPORT   PREOPERATIVE DIAGNOSIS:  Symptomatic cholelithiasis.   POSTOPERATIVE DIAGNOSIS:  Symptomatic cholelithiasis.   PROCEDURE:  Laparoscopic cholecystectomy and intraoperative cholangiogram.   SURGEON:  Dr. Leonie Man   ASSISTANT:  Dr. Claud Kelp   ANESTHESIA:  General.   Note, this patient is a 35 year old female with diagnosed cholelithiasis on  CT scan and on ultrasound, presenting with right upper quadrant pain  associated with nausea and vomiting.  She was admitted last evening with  persistent nausea and vomiting and not feeling any better today.  She is  brought to the operating room for a laparoscopic cholecystectomy after the  risks and potential benefits of surgery had been discussed, all questions  answered, and consent obtained.   DESCRIPTION OF PROCEDURE:  Following the induction of satisfactory general  anesthesia, the patient was positioned supinely and the abdomen prepped and  draped routinely, open laparoscopy created at the umbilicus with  insufflation of the peritoneal cavity to 14 mmHg pressure.  The camera is  inserted and video exploration of the abdomen carried out.  The gallbladder  did not appear to be acutely inflamed.  Anterior gastric wall, duodenal  sweep, and all of the small and large intestine visualized appeared to be  within normal limits.  Pelvic organs were not visualized.   All under direct vision, epigastric and lateral ports are placed.  The  gallbladder is grasped and retracted cephalad and dissection carried down to  the region of the ampulla of the gallbladder where the cystic artery  and  cystic duct were isolated.  The cystic duct was doubly proximally and  opened.  A cystic duct cholangiogram was carried out by passing a Reddick  catheter into the abdomen through a 14 gauge angiocath and then into the  cystic duct.  I injected one-half strength Hypaque dye into the biliary  system with prompt flow of contrast through the common hepatic ducts down  into the duodenum.  No filling defects, normal caliber ducts.  The catheter  was removed, and the cystic duct was then doubly clipped and then  transected.  The cystic artery was then triply clipped and transected.  The  gallbladder was dissected free from the liver bed using electrocautery and  maintaining hemostasis throughout the course of the dissection.  At the end  of this dissection, the liver bed was checked for hemostasis and noted to be  dry except for a few additional bleeding points which were treated with  electrocautery.  The right upper quadrant was then thoroughly irrigated with  normal saline and aspirated.  The gallbladder was then retrieved through the  umbilical port without difficulty.  Sponge, instrument, and sharp counts  were verified, the lateral trocars removed under direct vision,  pneumoperitoneum allowed  to deflate, and the wound closed in layers as  follows:  Umbilical wound in 2  layers with 0 Vicryl and 4-0 Monocryl.  Epigastric and lateral flank wounds  closed with 4-0 Monocryl sutures.  All wounds were reinforced with  Dermabond.  The anesthetic is reversed and the patient removed from the  operating room to the recovery room in stable condition.  She tolerated the  procedure well.     Patr   PB/MEDQ  D:  05/20/2004  T:  05/20/2004  Job:  [id number]

## 2010-11-29 NOTE — Op Note (Signed)
[name], [name]                    ACCOUNT NO.:  [id number]   MEDICAL RECORD NO.:  [id number]                   PATIENT TYPE:  INP   LOCATION:  9133                                 FACILITY:  [facility]   PHYSICIAN:  Hal Morales, M.D.             DATE OF BIRTH:  August 05, 1975   DATE OF PROCEDURE:  04/06/2003  DATE OF DISCHARGE:                                 OPERATIVE REPORT   PREOPERATIVE DIAGNOSES:  1. Intrauterine pregnancy at 38-39 weeks' gestation.  2. Failure to descend.  3. Persistent occiput posterior.   POSTOPERATIVE DIAGNOSES:  1. Intrauterine pregnancy at 38-39 weeks' gestation.  2. Failure to descend.  3. Persistent occiput posterior.   OPERATION:  Primary low transverse cesarean section.   SURGEON:  Hal Morales, M.D.   FIRST ASSISTANT:  Concha Pyo. Duplantis, C.N.M.   ANESTHESIA:  Epidural.   ESTIMATED BLOOD LOSS:  750 mL.   COMPLICATIONS:  None.   FINDINGS:  The patient was delivered of an 8 pound 11 ounce female infant  whose name is Sagen, with Apgars of 8 and 9 at one and five minutes  respectively.  The uterus, tubes, and ovaries were normal for the gravid  state.   PROCEDURE:  The patient was taken to the operating room after appropriate  identification and placed on the operating table.  After dosing the  patient's labor epidural, she was placed in the supine position with a left  lateral tilt.  The abdomen and perineum were prepped with multiple layers of  Betadine and a Foley catheter inserted into the bladder and connected to  straight drainage.  The abdomen was draped as a sterile field.  Suprapubic  injection of 0.25% Marcaine was undertaken.  A suprapubic incision was made.  The abdomen was opened in layers and the peritoneum entered.  The bladder  blade was placed.  The uterus was incised approximately 2 cm above the  uterovesical fold and the incision taken laterally on either side bluntly.  The infant was delivered from the  occiput posterior position and after  having the nares and pharynx suctioned and the cord clamped and cut, was  handed off to the awaiting pediatricians.  The appropriate cord blood was  drawn and the placenta noted to have separated from the uterus and was  removed from the operative field.  The uterine incision was closed with a  running, interlocking suture of 0 Vicryl.  An imbricating suture of 0 Vicryl  was then placed for adequate hemostasis.  Copious irrigation was carried  out.  The abdominal peritoneum was closed with a running suture of 2-0  Vicryl.  The rectus muscles were reapproximated in the midline with a figure-  of-eight suture of 2-0 Vicryl.  The rectus muscles were irrigated and made  hemostatic with Bovie cautery.  The rectus fascia was closed with a running  suture of 0 Vicryl then reinforced on either side of midline with  figure-of-  eight sutures of 0 Vicryl.  The subcutaneous tissue was copiously irrigated  and made hemostatic with Bovie cautery.  A Jackson-Pratt drain was placed in  the subcutaneous tissue and exited through a stab wound in the left lower  quadrant below the incision.  The drain was sewn in with a silk suture and  staples applied to the skin incision for incisional closure.  A sterile  dressing was applied and the patient taken from the operating room to the  recovery room in satisfactory condition having tolerated the procedure well  with sponge and instrument counts correct.  The infant went to the full-term  nursery.                                               Hal Morales, M.D.    VPH/MEDQ  D:  04/07/2003  T:  04/07/2003  Job:  [id number]

## 2010-11-29 NOTE — Op Note (Signed)
[facility]  Patient:    [name], [name] Visit Number: [id number] MRN: [id number]          Service Type: Attending:  Maris Berger. Pennie Rushing, M.D. Dictated by:   Maris Berger. Pennie Rushing, M.D. Proc. Date: 10/12/01                             Operative Report  PREOPERATIVE DIAGNOSES:       Pelvic pain.  POSTOPERATIVE DIAGNOSES:      Pelvic pain, probable endometriosis.  OPERATION:                    Operative laparoscopy.  ANESTHESIA:                   General orotracheal.  ESTIMATED BLOOD LOSS:         Less than 10 cc.  COMPLICATIONS:                None.  FINDINGS:                     The uterus, tubes, and ovaries appeared within normal limits without adhesions or stigmata of endometriosis.  In the anterior cul-de-sac there was a linear folding of the peritoneum with no specific lesions.  The posterior cul-de-sac beneath the left uterosacral ligament contained a 2 mm peritoneal defect consistent with a peritoneal window and suspicious for endometriosis.  The appendix appeared normal.  PROCEDURE:                    The patient was taken to the operating room after appropriate identification and after a discussion of the indications for the procedure and the risks involved which include, but are not limited to, anesthesia, bleeding, infection, and damage to adjacent organs.  She was placed on the operating table and general anesthesia was started.  She was placed in the modified lithotomy position.  The abdomen, perineum, and vagina were prepped with multiple layers of Betadine.  A Foley catheter was placed in the bladder under sterile conditions and connected to straight drainage.  A single tooth tenaculum was placed on the anterior cervix.  The abdomen was draped as a sterile field.  Subumbilical and suprapubic injection of 0.25% Marcaine for a total of 10 cc was undertaken.  A subumbilical incision was made and the Veress cannula placed through  that incision into the peritoneal cavity.  A pneumoperitoneum was created with 2.5 L of CO2.  The Veress cannula was removed and the laparoscopic trocar placed through the subumbilical incision into the peritoneal cavity.  The laparoscope was placed through the trocar sleeve.  A suprapubic incision was made to the left of midline and the laparoscopic probe trocar placed through that incision into the peritoneal cavity under direct visualization.  The above noted findings were made and documented.  The peritoneal window was excised and sent as an excision biopsy specimen.  A biopsy was also taken from the anterior cul-de-sac peritoneal fold and sent as a separate specimen.  Hemostasis was noted to be adequate and 100 cc of warm lactated Ringers was left in the peritoneal cavity.  All instruments were then removed from the peritoneal cavity under direct visualization as the CO2 was allowed to escape.  The subumbilical and suprapubic incisions were closed with subcuticular sutures of 3-0 Vicryl. Sterile dressings were applied.  The single tooth tenaculum and Foley catheter were removed.  The patient was awakened from general anesthesia and taken to the recovery room in satisfactory condition having tolerated the procedure well with sponge and instrument counts correct.  SPECIMEN:                     Posterior cul-de-sac excisional biopsy and anterior cul-de-sac biopsy. Dictated by:   Maris Berger. Pennie Rushing, M.D. Attending:  Maris Berger. Pennie Rushing, M.D. DD:  10/12/01 TD:  10/13/01 Job: [id number]

## 2014-08-18 ENCOUNTER — Emergency Department: Payer: Self-pay | Admitting: Emergency Medicine

## 2014-08-20 ENCOUNTER — Emergency Department: Payer: Self-pay | Admitting: Emergency Medicine

## 2014-12-10 ENCOUNTER — Encounter: Payer: Self-pay | Admitting: Emergency Medicine

## 2014-12-10 ENCOUNTER — Emergency Department
Admission: EM | Admit: 2014-12-10 | Discharge: 2014-12-10 | Disposition: A | Discharge status: Home / Self Care | Payer: Self-pay | Attending: Emergency Medicine | Admitting: Emergency Medicine

## 2014-12-10 ENCOUNTER — Emergency Department: Payer: Self-pay

## 2014-12-10 DIAGNOSIS — G43919 Migraine, unspecified, intractable, without status migrainosus: Secondary | ICD-10-CM | POA: Insufficient documentation

## 2014-12-10 DIAGNOSIS — Z87891 Personal history of nicotine dependence: Secondary | ICD-10-CM | POA: Insufficient documentation

## 2014-12-10 HISTORY — DX: Anxiety disorder, unspecified: F41.9

## 2014-12-10 HISTORY — DX: Migraine, unspecified, not intractable, without status migrainosus: G43.909

## 2014-12-10 MED ORDER — KETOROLAC TROMETHAMINE 30 MG/ML IJ SOLN
30.0000 mg | Freq: Once | INTRAMUSCULAR | Status: AC
  Administered 2014-12-10: 30 mg via INTRAVENOUS

## 2014-12-10 MED ORDER — METOCLOPRAMIDE HCL 5 MG/ML IJ SOLN
10.0000 mg | Freq: Once | INTRAMUSCULAR | Status: AC
  Administered 2014-12-10: 10 mg via INTRAVENOUS

## 2014-12-10 MED ORDER — HYDROMORPHONE HCL 1 MG/ML IJ SOLN
1.0000 mg | Freq: Once | INTRAMUSCULAR | Status: AC
Start: 2014-12-10 — End: 2014-12-10
  Administered 2014-12-10: 1 mg via INTRAVENOUS

## 2014-12-10 MED ORDER — SODIUM CHLORIDE 0.9 % IV BOLUS (SEPSIS)
1000.0000 mL | Freq: Once | INTRAVENOUS | Status: AC
  Administered 2014-12-10: 1000 mL via INTRAVENOUS

## 2014-12-10 MED ORDER — DIPHENHYDRAMINE HCL 50 MG/ML IJ SOLN
25.0000 mg | Freq: Once | INTRAMUSCULAR | Status: AC
  Administered 2014-12-10: 25 mg via INTRAVENOUS

## 2014-12-10 MED ORDER — ONDANSETRON HCL 4 MG/2ML IJ SOLN
4.0000 mg | Freq: Once | INTRAMUSCULAR | Status: AC
  Administered 2014-12-10: 4 mg via INTRAVENOUS

## 2014-12-10 NOTE — ED Provider Notes (Signed)
[facility] Emergency Department Provider Note  ____________________________________________  Time seen: Approximately 1:35 PM  I have reviewed the triage vital signs and the nursing notes.   HISTORY  Chief Complaint Migraine  HPI Sandra GentaKathryn Dawne Bond is a 39 y.o. female is here today with complaint of migraine headache. She states she has had a headache for several days with nausea. She has vomited 3 times today. She is positive for photosensitivity, and visual changes. She states her last CT was 12 years ago. In the past she has taken Topamax but also has not taken this in 12 years. She "had a bad reaction to Imitrex". Headache is frontal with no radiation and currently a 9 out of 10. She is tried over-the-counter ibuprofen without any relief.   Past Medical History  Diagnosis Date  . Migraine   . Anxiety     There are no active problems to display for this patient.   Past Surgical History  Procedure Laterality Date  . Abdominal hysterectomy    . Right ankle surgery    . Cholecystectomy    . Cesarean section      No current outpatient prescriptions on file.  Allergies Bee venom; Amoxicillin; and Sulfa antibiotics  No family history on file.  Social History History  Substance Use Topics  . Smoking status: Former Games developermoker  . Smokeless tobacco: Not on file  . Alcohol Use: No    Review of Systems Constitutional: No fever/chills Eyes: Positive visual changes left eye with headaches. ENT: No sore throat. Cardiovascular: Denies chest pain. Respiratory: Denies shortness of breath. Gastrointestinal: No abdominal pain.  Positive nausea, positive vomiting.  Genitourinary: Negative for dysuria. Musculoskeletal: Negative for back pain. Skin: Negative for rash. Neurological: Positive for headaches, negative for focal weakness or numbness.  10-point ROS otherwise negative.  ____________________________________________   PHYSICAL  EXAM:  VITAL SIGNS: ED Triage Vitals  Enc Vitals Group     BP 12/10/14 1228 151/101 mmHg     Pulse Rate 12/10/14 1228 95     Resp 12/10/14 1228 22     Temp 12/10/14 1228 98.1 F (36.7 C)     Temp Source 12/10/14 1228 Oral     SpO2 12/10/14 1228 97 %     Weight 12/10/14 1229 178 lb (80.74 kg)     Height 12/10/14 1229 5\' 1"  (1.549 m)     Head Cir --      Peak Flow --      Pain Score 12/10/14 1233 9     Pain Loc --      Pain Edu? --      Excl. in GC? --     Constitutional: Alert and oriented. Well appearing and in no acute distress. Photophobic Eyes: Conjunctivae are normal. PERRL. EOMI. photophobic Head: Atraumatic. Nose: No congestion/rhinnorhea. Mouth/Throat: Mucous membranes are moist.   Neck: No stridor.  Supple Hematological/Lymphatic/Immunilogical: No cervical lymphadenopathy. Cardiovascular: Normal rate, regular rhythm. Grossly normal heart sounds.  Good peripheral circulation. Respiratory: Normal respiratory effort.  No retractions. Lungs CTAB. Gastrointestinal: Soft and nontender. No distention. Musculoskeletal: No lower extremity tenderness nor edema.  No joint effusions. Neurologic:  Normal speech and language. No gross focal neurologic deficits are appreciated. Speech is normal. No gait instability. Cranial nerves II through XII grossly intact. Reflexes are equal bilaterally lower extremities. Skin:  Skin is warm, dry and intact. No rash noted. Psychiatric: Mood and affect are normal. Speech and behavior are normal.  ____________________________________________   LABS (all labs ordered are  listed, but only abnormal results are displayed)  Labs Reviewed - No data to display RADIOLOGY  CT head:  normal CT the radiologist ____________________________________________   PROCEDURES  Procedure(s) performed: None  Critical Care performed: No  ____________________________________________   INITIAL IMPRESSION / ASSESSMENT AND PLAN / ED COURSE  Pertinent  labs & imaging results that were available during my care of the patient were reviewed by me and considered in my medical decision making (see chart for details).  Patient was reassured CT was negative. She was given IV medication without any improvement with Reglan, Benadryl, Toradol. On after Dilaudid she was able to get some sleep and states that her headache has improved. ____________________________________________   FINAL CLINICAL IMPRESSION(S) / ED DIAGNOSES  Final diagnoses:  Intractable migraine without status migrainosus, unspecified migraine type      Tommi Rumps, PA-C 12/10/14 1648  Phineas Semen, MD 12/11/14 [phone]

## 2014-12-10 NOTE — Discharge Instructions (Signed)
General Headache Without Cause  A general headache is pain or discomfort felt around the head or neck area. The cause may not be found.   HOME CARE   · Keep all doctor visits.  · Only take medicines as told by your doctor.  · Lie down in a dark, quiet room when you have a headache.  · Keep a journal to find out if certain things bring on headaches. For example, write down:  ¨ What you eat and drink.  ¨ How much sleep you get.  ¨ Any change to your diet or medicines.  · Relax by getting a massage or doing other relaxing activities.  · Put ice or heat packs on the head and neck area as told by your doctor.  · Lessen stress.  · Sit up straight. Do not tighten (tense) your muscles.  · Quit smoking if you smoke.  · Lessen how much alcohol you drink.  · Lessen how much caffeine you drink, or stop drinking caffeine.  · Eat and sleep on a regular schedule.  · Get 7 to 9 hours of sleep, or as told by your doctor.  · Keep lights dim if bright lights bother you or make your headaches worse.  GET HELP RIGHT AWAY IF:   · Your headache becomes really bad.  · You have a fever.  · You have a stiff neck.  · You have trouble seeing.  · Your muscles are weak, or you lose muscle control.  · You lose your balance or have trouble walking.  · You feel like you will pass out (faint), or you pass out.  · You have really bad symptoms that are different than your first symptoms.  · You have problems with the medicines given to you by your doctor.  · Your medicines do not work.  · Your headache feels different than the other headaches.  · You feel sick to your stomach (nauseous) or throw up (vomit).  MAKE SURE YOU:   · Understand these instructions.  · Will watch your condition.  · Will get help right away if you are not doing well or get worse.  Document Released: 04/08/2008 Document Revised: 09/22/2011 Document Reviewed: 06/20/2011  Exit[employer]® Patient Information ©2015 [employer]. This information is not intended to replace advice given to  you by your health care provider. Make sure you discuss any questions you have with your health care provider.

## 2014-12-10 NOTE — ED Notes (Addendum)
Pt reports migraine headache with nausea. Eye and facial pain pt also reports light sensitivity. Pt reports history of migraines.

## 2017-03-01 ENCOUNTER — Emergency Department
Admission: EM | Admit: 2017-03-01 | Discharge: 2017-03-01 | Disposition: A | Discharge status: Home / Self Care | Payer: Self-pay | Attending: Emergency Medicine | Admitting: Emergency Medicine

## 2017-03-01 DIAGNOSIS — N3091 Cystitis, unspecified with hematuria: Secondary | ICD-10-CM | POA: Insufficient documentation

## 2017-03-01 DIAGNOSIS — N3090 Cystitis, unspecified without hematuria: Secondary | ICD-10-CM

## 2017-03-01 DIAGNOSIS — Z87891 Personal history of nicotine dependence: Secondary | ICD-10-CM | POA: Insufficient documentation

## 2017-03-01 DIAGNOSIS — R300 Dysuria: Secondary | ICD-10-CM | POA: Insufficient documentation

## 2017-03-01 LAB — URINALYSIS, COMPLETE (UACMP) WITH MICROSCOPIC
BILIRUBIN URINE: NEGATIVE
Glucose, UA: NEGATIVE mg/dL
KETONES UR: NEGATIVE mg/dL
NITRITE: NEGATIVE
PH: 6 (ref 5.0–8.0)
Protein, ur: NEGATIVE mg/dL
Specific Gravity, Urine: 1.002 — ABNORMAL LOW (ref 1.005–1.030)

## 2017-03-01 MED ORDER — CEPHALEXIN 500 MG PO CAPS: 500.0000 mg | ORAL_CAPSULE | Freq: Two times a day (BID) | ORAL | 0 refills | Status: AC

## 2017-03-01 MED ORDER — CEPHALEXIN 500 MG PO CAPS
500.0000 mg | ORAL_CAPSULE | Freq: Once | ORAL | Status: AC
  Administered 2017-03-01: 500 mg via ORAL
  Filled 2017-03-01: qty 1

## 2017-03-01 MED ORDER — FLUCONAZOLE 100 MG PO TABS: 100.0000 mg | ORAL_TABLET | Freq: Every day | ORAL | 0 refills | Status: AC

## 2017-03-01 NOTE — ED Triage Notes (Signed)
Patient reports hematuria for approximately 9 days.

## 2017-03-01 NOTE — ED Provider Notes (Signed)
[facility] Emergency Department Provider Note   ____________________________________________   I have reviewed the triage vital signs and the nursing notes.   HISTORY  Chief Complaint Hematuria    HPI [name] is a 41 y.o. female Presents to the emergency department with hematuria,dysuria increased urgency that developed 9 days ago. Patient denies fever, nausea, vomiting, abdominal pain or flank pain associated with the above symptoms. Patient  Reports past history of urinary tract infections although today has been 4-5 years since her last episode. Patient has tried Azo, cranberry juice and other over-the-counter conservative treatments without relief. Patient denies headache, vision changes, chest pain, chest tightness, shortness of breath.  Past Medical History:  Diagnosis Date  . Anxiety   . Migraine     There are no active problems to display for this patient.   Past Surgical History:  Procedure Laterality Date  . ABDOMINAL HYSTERECTOMY    . CESAREAN SECTION    . CHOLECYSTECTOMY    . right ankle surgery      Prior to Admission medications   Medication Sig Start Date End Date Taking? Authorizing Provider  cephALEXin (KEFLEX) 500 MG capsule Take 1 capsule (500 mg total) by mouth 2 (two) times daily. 03/01/17 03/08/17  Adalyne Lovick M, PA-C  fluconazole (DIFLUCAN) 100 MG tablet Take 1 tablet (100 mg total) by mouth daily. 03/09/17 03/11/17  Gwenivere Hiraldo M, PA-C    Allergies Bee venom; Amoxicillin; and Sulfa antibiotics  No family history on file.  Social History Social History  Substance Use Topics  . Smoking status: Former Games developer  . Smokeless tobacco: Not on file  . Alcohol use No    Review of Systems Constitutional: Negative for fever/chills Eyes: No visual changes. ENT:  Negative for sore throat and for difficulty swallowing Cardiovascular: Denies chest pain. Respiratory: Denies cough. Denies shortness of  breath. Gastrointestinal: No abdominal pain.  No nausea, vomiting, diarrhea. Genitourinary: Positive for hematuria,dysuria and  increased urgency. Musculoskeletal: Negative for back pain. Skin: Negative for rash. Neurological: Negative for headaches.  Negative focal weakness or numbness. Negative for loss of consciousness. Able to ambulate. ____________________________________________   PHYSICAL EXAM:  VITAL SIGNS: ED Triage Vitals  Enc Vitals Group     BP 03/01/17 1926 125/71     Pulse Rate 03/01/17 1926 73     Resp 03/01/17 1926 20     Temp 03/01/17 1926 97.8 F (36.6 C)     Temp Source 03/01/17 1926 Oral     SpO2 03/01/17 1926 96 %     Weight 03/01/17 1927 170 lb (77.1 kg)     Height 03/01/17 1927 5\' 1"  (1.549 m)     Head Circumference --      Peak Flow --      Pain Score --      Pain Loc --      Pain Edu? --      Excl. in GC? --     Constitutional: Alert and oriented. Well appearing and in no acute distress.  Eyes: Conjunctivae are normal. PERRL. EOMI  Head: Normocephalic and atraumatic. ENT:      Ears: Canals clear. TMs intact bilaterally.      Nose: No congestion/rhinnorhea.      Mouth/Throat: Mucous membranes are moist.  Neck:Supple. No thyromegaly. No stridor.  Cardiovascular: Normal rate, regular rhythm. Normal S1 and S2.  Good peripheral circulation. Respiratory: Normal respiratory effort without tachypnea or retractions. Lungs CTAB. No wheezes/rales/rhonchi. Good air entry to the bases  with no decreased or absent breath sounds. Hematological/Lymphatic/Immunological: No cervical lymphadenopathy. Cardiovascular: Normal rate, regular rhythm. Normal distal pulses. Gastrointestinal: Bowel sounds 4 quadrants. Soft and nontender to palpation. No guarding or rigidity. No palpable masses. No distention. No CVA tenderness. Genitourinary: Hematuria,dysuria and  increased urgency.  Musculoskeletal: Nontender with normal range of motion in all extremities. Neurologic:  Normal speech and language.  Skin:  Skin is warm, dry and intact. No rash noted. Psychiatric: Mood and affect are normal. Speech and behavior are normal. Patient exhibits appropriate insight and judgement.  ____________________________________________   LABS (all labs ordered are listed, but only abnormal results are displayed)  Labs Reviewed  URINALYSIS, COMPLETE (UACMP) WITH MICROSCOPIC - Abnormal; Notable for the following:       Result Value   Color, Urine STRAW (*)    APPearance CLEAR (*)    Specific Gravity, Urine 1.002 (*)    Hgb urine dipstick SMALL (*)    Leukocytes, UA MODERATE (*)    Bacteria, UA RARE (*)    Squamous Epithelial / LPF 0-5 (*)    All other components within normal limits   ____________________________________________  EKG none ____________________________________________  RADIOLOGY none ____________________________________________   PROCEDURES  Procedure(s) performed: none    Critical Care performed: no ____________________________________________   INITIAL IMPRESSION / ASSESSMENT AND PLAN / ED COURSE  Pertinent labs & imaging results that were available during my care of the patient were reviewed by me and considered in my medical decision making (see chart for details).  Patient presented to the emergency department with dysuria, increased urinary frequency and hematuria for 9 days. History, physical exam  And labs are reassuring symptoms consistent with acute cystitis. Patient received initial dose of cephalexin during course of care in the emergency department. Patient will be given course of cephalexin and encouraged to hydrate. Also recommended she utilize OTC AZO or other symptom relievers as needed. Patient informed of clinical course, understand medical decision-making process, and agree with plan. Patient was advised to follow up with PCP as needed and was also advised to return to the emergency department for symptoms that change or  worsen.   ____________________________________________   FINAL CLINICAL IMPRESSION(S) / ED DIAGNOSES  Final diagnoses:  Cystitis  Dysuria       NEW MEDICATIONS STARTED DURING THIS VISIT:  Discharge Medication List as of 03/01/2017 10:06 PM    START taking these medications   Details  cephALEXin (KEFLEX) 500 MG capsule Take 1 capsule (500 mg total) by mouth 2 (two) times daily., Starting Sun 03/01/2017, Until Sun 03/08/2017, Print    fluconazole (DIFLUCAN) 100 MG tablet Take 1 tablet (100 mg total) by mouth daily., Starting Mon 03/09/2017, Until Wed 03/11/2017, Print         Note:  This document was prepared using Dragon voice recognition software and may include unintentional dictation errors.   Percell Boston 03/01/17 2237    Jene Every, MD 03/01/17 [phone]

## 2017-07-30 ENCOUNTER — Encounter: Payer: Self-pay | Admitting: Emergency Medicine

## 2017-07-30 ENCOUNTER — Other Ambulatory Visit: Payer: Self-pay

## 2017-07-30 ENCOUNTER — Emergency Department: Payer: Self-pay

## 2017-07-30 ENCOUNTER — Emergency Department
Admission: EM | Admit: 2017-07-30 | Discharge: 2017-07-30 | Disposition: A | Discharge status: Home / Self Care | Payer: Self-pay | Attending: Emergency Medicine | Admitting: Emergency Medicine

## 2017-07-30 DIAGNOSIS — Y9389 Activity, other specified: Secondary | ICD-10-CM | POA: Insufficient documentation

## 2017-07-30 DIAGNOSIS — W010XXA Fall on same level from slipping, tripping and stumbling without subsequent striking against object, initial encounter: Secondary | ICD-10-CM | POA: Insufficient documentation

## 2017-07-30 DIAGNOSIS — S60222A Contusion of left hand, initial encounter: Secondary | ICD-10-CM | POA: Insufficient documentation

## 2017-07-30 DIAGNOSIS — Y929 Unspecified place or not applicable: Secondary | ICD-10-CM | POA: Insufficient documentation

## 2017-07-30 DIAGNOSIS — Y998 Other external cause status: Secondary | ICD-10-CM | POA: Insufficient documentation

## 2017-07-30 DIAGNOSIS — Z87891 Personal history of nicotine dependence: Secondary | ICD-10-CM | POA: Insufficient documentation

## 2017-07-30 DIAGNOSIS — Z9049 Acquired absence of other specified parts of digestive tract: Secondary | ICD-10-CM | POA: Insufficient documentation

## 2017-07-30 DIAGNOSIS — F419 Anxiety disorder, unspecified: Secondary | ICD-10-CM | POA: Insufficient documentation

## 2017-07-30 MED ORDER — MELOXICAM 7.5 MG PO TABS: 7.5000 mg | ORAL_TABLET | Freq: Every day | ORAL | 1 refills | Status: AC

## 2017-07-30 NOTE — ED Triage Notes (Signed)
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  Bent left fingers back and  Now pain especially in ring and middle fingers.

## 2017-07-30 NOTE — ED Provider Notes (Signed)
[facility] Emergency Department Provider Note  ____________________________________________  Time seen: Approximately 3:50 PM  I have reviewed the triage vital signs and the nursing notes.   HISTORY  Chief Complaint Hand Injury and Fall    HPI [name] is a 42 y.o. female presents to the emergency department with left hand pain after patient reports that she tripped over a plastic mat at her place of work.  Patient fell on an outstretched hand, which resulted in hyperextension of digits 2 through 5.  Patient reports that she has been able to make a fist when is not experiencing left upper extremity avoidance.  She has noticed no weakness or changes in sensation.  No numbness or tingling.  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  No alleviating medications were attempted prior to presenting to the emergency department.   Past Medical History:  Diagnosis Date  . Anxiety   . Migraine     There are no active problems to display for this patient.   Past Surgical History:  Procedure Laterality Date  . ABDOMINAL HYSTERECTOMY    . CESAREAN SECTION    . CHOLECYSTECTOMY    . right ankle surgery      Prior to Admission medications   Medication Sig Start Date End Date Taking? Authorizing Provider  meloxicam (MOBIC) 7.5 MG tablet Take 1 tablet (7.5 mg total) by mouth daily for 7 days. 07/30/17 08/06/17  Orvil Feil, PA-C    Allergies Bee venom; Amoxicillin; and Sulfa antibiotics  No family history on file.  Social History Social History   Tobacco Use  . Smoking status: Former Games developer  . Smokeless tobacco: Never Used  Substance Use Topics  . Alcohol use: No  . Drug use: No    Review of Systems  Constitutional: No fever/chills Eyes: No visual changes. No discharge ENT: No upper respiratory complaints. Cardiovascular: no chest pain. Respiratory: no cough. No SOB. Musculoskeletal: Patient has left hand pain. Skin: Negative for rash,  abrasions, lacerations, ecchymosis. Neurological: Negative for headaches, focal weakness or numbness.  ____________________________________________   PHYSICAL EXAM:  VITAL SIGNS: ED Triage Vitals  Enc Vitals Group     BP 07/30/17 1454 (!) 128/93     Pulse Rate 07/30/17 1454 78     Resp 07/30/17 1454 16     Temp 07/30/17 1454 98.1 F (36.7 C)     Temp Source 07/30/17 1454 Oral     SpO2 07/30/17 1454 100 %     Weight 07/30/17 1455 170 lb (77.1 kg)     Height 07/30/17 1455 5\' 1"  (1.549 m)     Head Circumference --      Peak Flow --      Pain Score 07/30/17 1454 8     Pain Loc --      Pain Edu? --      Excl. in GC? --      Constitutional: Alert and oriented. Well appearing and in no acute distress. Eyes: Conjunctivae are normal. PERRL. EOMI. Head: Atraumatic. Cardiovascular: Normal rate, regular rhythm. Normal S1 and S2.  Good peripheral circulation. Respiratory: Normal respiratory effort without tachypnea or retractions. Lungs CTAB. Good air entry to the bases with no decreased or absent breath sounds. Musculoskeletal: Patient can make a fist.  She is able to move all 5 left fingers.  Patient is able to perform resisted flexion and extension at the PIP and DIP joints of left second through fifth digits.  Palpable radial pulse, left. Neurologic:  Normal speech and language. No gross focal neurologic deficits are appreciated.  Skin:  Skin is warm, dry and intact. No rash noted.  ____________________________________________   LABS (all labs ordered are listed, but only abnormal results are displayed)  Labs Reviewed - No data to display ____________________________________________  EKG   ____________________________________________  RADIOLOGY Geraldo PitterI, Jaclyn M Woods, personally viewed and evaluated these images (plain radiographs) as part of my medical decision making, as well as reviewing the written report by the radiologist.  Dg Hand Complete Left  Result Date:  07/30/2017 CLINICAL DATA:  Status post fall at work with left hand pain. EXAM: LEFT HAND - COMPLETE 3+ VIEW COMPARISON:  None. FINDINGS: There is no evidence of fracture or dislocation. There is no evidence of arthropathy or other focal bone abnormality. Soft tissues are unremarkable. IMPRESSION: Negative. Electronically Signed   By: Sherian ReinWei-Chen  Lin M.D.   On: 07/30/2017 15:28    ____________________________________________    PROCEDURES  Procedure(s) performed:    Procedures    Medications - No data to display   ____________________________________________   INITIAL IMPRESSION / ASSESSMENT AND PLAN / ED COURSE  Pertinent labs & imaging results that were available during my care of the patient were reviewed by me and considered in my medical decision making (see chart for details).  Review of the [place] CSRS was performed in accordance of the [facility] prior to dispensing any controlled drugs.     Assessment and plan Left hand pain Patient presents to the emergency department with left hand pain.  Differential diagnosis included fracture versus left hand contusion versus flexor or extensor tendon damage.  Overall physical exam was reassuring.  X-ray revealed no acute fractures or bony abnormalities.  Patient was referred to Dr. Stephenie AcresSoria and discharged with meloxicam.  Vital signs were reassuring prior to discharge.  All patient questions were answered.     ____________________________________________  FINAL CLINICAL IMPRESSION(S) / ED DIAGNOSES  Final diagnoses:  Contusion of left hand, initial encounter      NEW MEDICATIONS STARTED DURING THIS VISIT:  ED Discharge Orders        Ordered    meloxicam (MOBIC) 7.5 MG tablet  Daily     07/30/17 1544          This chart was dictated using voice recognition software/Dragon. Despite best efforts to proofread, errors can occur which can change the meaning. Any change was purely unintentional.    Orvil FeilWoods, Jaclyn M,  PA-C 07/30/17 [id number]    Sharyn CreamerQuale, Mark, MD 08/02/17 [phone]

## 2018-02-02 ENCOUNTER — Emergency Department
Admission: EM | Admit: 2018-02-02 | Discharge: 2018-02-02 | Disposition: A | Discharge status: Home / Self Care | Payer: Self-pay | Attending: Emergency Medicine | Admitting: Emergency Medicine

## 2018-02-02 ENCOUNTER — Emergency Department: Payer: Self-pay

## 2018-02-02 ENCOUNTER — Other Ambulatory Visit: Payer: Self-pay

## 2018-02-02 DIAGNOSIS — N390 Urinary tract infection, site not specified: Secondary | ICD-10-CM | POA: Insufficient documentation

## 2018-02-02 DIAGNOSIS — F17210 Nicotine dependence, cigarettes, uncomplicated: Secondary | ICD-10-CM | POA: Insufficient documentation

## 2018-02-02 HISTORY — DX: Disorder of kidney and ureter, unspecified: N28.9

## 2018-02-02 LAB — URINALYSIS, COMPLETE (UACMP) WITH MICROSCOPIC
BACTERIA UA: NONE SEEN
BILIRUBIN URINE: NEGATIVE
Glucose, UA: NEGATIVE mg/dL
KETONES UR: NEGATIVE mg/dL
Nitrite: NEGATIVE
PH: 7 (ref 5.0–8.0)
PROTEIN: NEGATIVE mg/dL
SQUAMOUS EPITHELIAL / LPF: NONE SEEN (ref 0–5)
Specific Gravity, Urine: 1.006 (ref 1.005–1.030)

## 2018-02-02 LAB — PREGNANCY, URINE: PREG TEST UR: NEGATIVE

## 2018-02-02 MED ORDER — FLUCONAZOLE 100 MG PO TABS: 150.0000 mg | ORAL_TABLET | Freq: Every day | ORAL | 0 refills | Status: DC

## 2018-02-02 MED ORDER — PHENAZOPYRIDINE HCL 100 MG PO TABS: 100.0000 mg | ORAL_TABLET | Freq: Three times a day (TID) | ORAL | 0 refills | Status: DC | PRN

## 2018-02-02 MED ORDER — PHENAZOPYRIDINE HCL 200 MG PO TABS
95.0000 mg | ORAL_TABLET | Freq: Once | ORAL | Status: AC
  Administered 2018-02-02: 200 mg via ORAL
  Filled 2018-02-02: qty 1

## 2018-02-02 MED ORDER — NITROFURANTOIN MONOHYD MACRO 100 MG PO CAPS
100.0000 mg | ORAL_CAPSULE | Freq: Once | ORAL | Status: AC
  Administered 2018-02-02: 100 mg via ORAL
  Filled 2018-02-02: qty 1

## 2018-02-02 MED ORDER — NITROFURANTOIN MONOHYD MACRO 100 MG PO CAPS: 100.0000 mg | ORAL_CAPSULE | Freq: Two times a day (BID) | ORAL | 0 refills | Status: DC

## 2018-02-02 NOTE — ED Notes (Signed)
Pt reports approximately 3 days of pain and bleeding with urination. Used AZO otc with no relief.

## 2018-02-02 NOTE — ED Triage Notes (Signed)
C/O pain with urination and hematuria x 3 days.  Has taken AZO, which intially improved symptoms but symptoms have worsened.

## 2018-02-02 NOTE — ED Provider Notes (Signed)
[facility] Emergency Department Provider Note  ____________________________________________  Time seen: Approximately 7:36 PM  I have reviewed the triage vital signs and the nursing notes.   HISTORY  Chief Complaint Dysuria    HPI [name] is a 42 y.o. female presents emergency department for evaluation of dysuria and hematuria for 3 days.  Patient states that she had similar symptoms about 8 months ago.  Her provider at that time thought she had a kidney stone.  She never had any imaging and was sent home with antibiotics and symptoms improved.  She has had kidney stones in the past.  No back pain, abdominal pain, fever, chills.   Past Medical History:  Diagnosis Date  . Renal disorder    kidney stones    There are no active problems to display for this patient.    Prior to Admission medications   Medication Sig Start Date End Date Taking? Authorizing Provider  fluconazole (DIFLUCAN) 100 MG tablet Take 1.5 tablets (150 mg total) by mouth daily for 1 day. 02/02/18 02/03/18  Enid Derry, PA-C  nitrofurantoin, macrocrystal-monohydrate, (MACROBID) 100 MG capsule Take 1 capsule (100 mg total) by mouth 2 (two) times daily for 7 days. 02/02/18 02/09/18  Enid Derry, PA-C  phenazopyridine (PYRIDIUM) 100 MG tablet Take 1 tablet (100 mg total) by mouth 3 (three) times daily as needed for up to 2 days for pain. 02/02/18 02/04/18  Enid Derry, PA-C    Allergies Amoxapine and related; Darvon [propoxyphene]; and Sulfa antibiotics  No family history on file.  Social History Social History   Tobacco Use  . Smoking status: Current Every Day Smoker    Types: Cigarettes  . Smokeless tobacco: Never Used  Substance Use Topics  . Alcohol use: Not on file  . Drug use: Not on file     Review of Systems  Constitutional: No fever/chills Cardiovascular: No chest pain. Respiratory: No SOB. Gastrointestinal: No abdominal pain.  No nausea, no  vomiting.  Musculoskeletal: Negative for musculoskeletal pain. Skin: Negative for rash, abrasions, lacerations, ecchymosis.  ____________________________________________   PHYSICAL EXAM:  VITAL SIGNS: ED Triage Vitals  Enc Vitals Group     BP 02/02/18 1902 105/65     Pulse Rate 02/02/18 1902 71     Resp 02/02/18 1902 18     Temp 02/02/18 1902 98.4 F (36.9 C)     Temp Source 02/02/18 1902 Oral     SpO2 02/02/18 1902 100 %     Weight 02/02/18 1852 168 lb (76.2 kg)     Height 02/02/18 1852 5\' 1"  (1.549 m)     Head Circumference --      Peak Flow --      Pain Score 02/02/18 1852 10     Pain Loc --      Pain Edu? --      Excl. in GC? --      Constitutional: Alert and oriented. Well appearing and in no acute distress. Eyes: Conjunctivae are normal. PERRL. EOMI. Head: Atraumatic. ENT:      Ears:      Nose: No congestion/rhinnorhea.      Mouth/Throat: Mucous membranes are moist.  Neck: No stridor. Cardiovascular: Normal rate, regular rhythm.  Good peripheral circulation. Respiratory: Normal respiratory effort without tachypnea or retractions. Lungs CTAB. Good air entry to the bases with no decreased or absent breath sounds. Gastrointestinal: Bowel sounds 4 quadrants. Soft and nontender to palpation. No guarding or rigidity. No palpable masses. No distention. No CVA tenderness.  Musculoskeletal: Full range of motion to all extremities. No gross deformities appreciated. Neurologic:  Normal speech and language. No gross focal neurologic deficits are appreciated.  Skin:  Skin is warm, dry and intact. No rash noted. Psychiatric: Mood and affect are normal. Speech and behavior are normal. Patient exhibits appropriate insight and judgement.   ____________________________________________   LABS (all labs ordered are listed, but only abnormal results are displayed)  Labs Reviewed  URINALYSIS, COMPLETE (UACMP) WITH MICROSCOPIC - Abnormal; Notable for the following components:       Result Value   Color, Urine YELLOW (*)    APPearance CLOUDY (*)    Hgb urine dipstick MODERATE (*)    Leukocytes, UA LARGE (*)    WBC, UA >50 (*)    All other components within normal limits  URINE CULTURE  PREGNANCY, URINE   ____________________________________________  EKG   ____________________________________________  RADIOLOGY Lexine Baton, personally viewed and evaluated these images (plain radiographs) as part of my medical decision making, as well as reviewing the written report by the radiologist.  Ct Renal Stone Study  Result Date: 02/02/2018 CLINICAL DATA:  Bilateral flank and lower quadrant pain for 3 days, hematuria and dysuria. History of kidney stones. EXAM: CT ABDOMEN AND PELVIS WITHOUT CONTRAST TECHNIQUE: Multidetector CT imaging of the abdomen and pelvis was performed following the standard protocol without IV contrast. COMPARISON:  None. FINDINGS: LOWER CHEST: Lung bases are clear. The visualized heart size is normal. No pericardial effusion. HEPATOBILIARY: Status post cholecystectomy. Negative non-contrast CT liver. PANCREAS: Normal. SPLEEN: Normal. ADRENALS/URINARY TRACT: Kidneys are orthotopic, demonstrating normal size and morphology. No nephrolithiasis, hydronephrosis; limited assessment for renal masses by nonenhanced CT. The unopacified ureters are normal in course and caliber. Urinary bladder is partially distended and unremarkable. Normal adrenal glands. STOMACH/BOWEL: The stomach, small and large bowel are normal in course and caliber without inflammatory changes, sensitivity decreased by lack of enteric contrast. Mild amount of retained large bowel stool. Normal appendix. VASCULAR/LYMPHATIC: Aortoiliac vessels are normal in course and caliber. No lymphadenopathy by CT size criteria. REPRODUCTIVE: Status post hysterectomy. OTHER: Small volume free fluid in the pelvis is likely physiologic. No intraperitoneal free air. Phleboliths in the pelvis. MUSCULOSKELETAL:  Non-acute. Small to moderate fat containing umbilical hernia with surgical clip. Mild sacroiliac osteoarthrosis. IMPRESSION: 1. No nephrolithiasis, hydronephrosis or acute intra-abdominal/pelvic process. 2. Status post hysterectomy and cholecystectomy. Electronically Signed   By: Awilda Metro M.D.   On: 02/02/2018 21:54    ____________________________________________    PROCEDURES  Procedure(s) performed:    Procedures    Medications  nitrofurantoin (macrocrystal-monohydrate) (MACROBID) capsule 100 mg (100 mg Oral Given 02/02/18 2301)  phenazopyridine (PYRIDIUM) tablet 200 mg (200 mg Oral Given 02/02/18 2302)     ____________________________________________   INITIAL IMPRESSION / ASSESSMENT AND PLAN / ED COURSE  Pertinent labs & imaging results that were available during my care of the patient were reviewed by me and considered in my medical decision making (see chart for details).  Review of the [place] CSRS was performed in accordance of the [facility] prior to dispensing any controlled drugs.  Patient's diagnosis is consistent with UTI. Urinalysis consistent with infection and will be sent for culture.  Patient denies any back pain or abdominal pain.  No nephrolithiasis or hydronephrosis on CT.  Patient is allergic to penicillins and Bactrim and gets hives with both.  Patient will be discharged home with prescriptions for Macrobid and Pyridium.  Patient is requesting Diflucan prescription for prophylactically for yeast infection.  Patient is to follow up with PCP as directed. Patient is given ED precautions to return to the ED for any worsening or new symptoms.     ____________________________________________  FINAL CLINICAL IMPRESSION(S) / ED DIAGNOSES  Final diagnoses:  Lower urinary tract infectious disease      NEW MEDICATIONS STARTED DURING THIS VISIT:  ED Discharge Orders        Ordered    nitrofurantoin, macrocrystal-monohydrate, (MACROBID) 100 MG capsule  2 times  daily     02/02/18 2230    phenazopyridine (PYRIDIUM) 100 MG tablet  3 times daily PRN     02/02/18 2230    fluconazole (DIFLUCAN) 100 MG tablet  Daily     02/02/18 2250          This chart was dictated using voice recognition software/Dragon. Despite best efforts to proofread, errors can occur which can change the meaning. Any change was purely unintentional.    Enid DerryWagner, Ashauna Bertholf, PA-C 02/02/18 2333    Don PerkingVeronese, [place], MD 02/03/18 [phone]

## 2018-02-04 LAB — URINE CULTURE: Culture: NO GROWTH

## 2018-06-23 ENCOUNTER — Emergency Department
Admission: EM | Admit: 2018-06-23 | Discharge: 2018-06-23 | Disposition: A | Discharge status: Home / Self Care | Payer: Self-pay | Attending: Emergency Medicine | Admitting: Emergency Medicine

## 2018-06-23 ENCOUNTER — Encounter: Payer: Self-pay | Admitting: Emergency Medicine

## 2018-06-23 DIAGNOSIS — H6993 Unspecified Eustachian tube disorder, bilateral: Secondary | ICD-10-CM | POA: Insufficient documentation

## 2018-06-23 DIAGNOSIS — H6983 Other specified disorders of Eustachian tube, bilateral: Secondary | ICD-10-CM

## 2018-06-23 DIAGNOSIS — R0981 Nasal congestion: Secondary | ICD-10-CM

## 2018-06-23 DIAGNOSIS — Z87891 Personal history of nicotine dependence: Secondary | ICD-10-CM | POA: Insufficient documentation

## 2018-06-23 MED ORDER — FEXOFENADINE-PSEUDOEPHED ER 60-120 MG PO TB12: 1.0000 | ORAL_TABLET | Freq: Two times a day (BID) | ORAL | 0 refills | Status: AC

## 2018-06-23 MED ORDER — TRAMADOL HCL 50 MG PO TABS: 50.0000 mg | ORAL_TABLET | Freq: Two times a day (BID) | ORAL | 0 refills | Status: AC | PRN

## 2018-06-23 NOTE — ED Triage Notes (Signed)
Pt reports right earache for the past 2 days worsening. Pt states that she thinks her left ear is starting to act up now as well.

## 2018-06-23 NOTE — ED Notes (Signed)
bilat ear pain xfew days with + congestion

## 2018-06-23 NOTE — ED Provider Notes (Signed)
[facility] Emergency Department Provider Note   ____________________________________________   First MD Initiated Contact with Patient 06/23/18 1142     (approximate)  I have reviewed the triage vital signs and the nursing notes.   HISTORY  Chief Complaint Otalgia and Nasal Congestion    HPI [name] is a 42 y.o. female patient complain of bilateral ear pain for 2 days.  Patient also complain of mild hearing loss.  Patient states his nasal congestion which started before the ear pain.  Patient described the pain is "achy/pressure".  Patient rates pain as 8/10.  No palliative measure for complaint.  Past Medical History:  Diagnosis Date  . Anxiety   . Migraine     There are no active problems to display for this patient.   Past Surgical History:  Procedure Laterality Date  . ABDOMINAL HYSTERECTOMY    . CESAREAN SECTION    . CHOLECYSTECTOMY    . right ankle surgery      Prior to Admission medications   Medication Sig Start Date End Date Taking? Authorizing Provider  fexofenadine-pseudoephedrine (ALLEGRA-D) 60-120 MG 12 hr tablet Take 1 tablet by mouth 2 (two) times daily. 06/23/18   Joni Reining, PA-C  traMADol (ULTRAM) 50 MG tablet Take 1 tablet (50 mg total) by mouth every 12 (twelve) hours as needed. 06/23/18   Joni Reining, PA-C    Allergies Bee venom; Amoxicillin; and Sulfa antibiotics  No family history on file.  Social History Social History   Tobacco Use  . Smoking status: Former Games developer  . Smokeless tobacco: Never Used  Substance Use Topics  . Alcohol use: No  . Drug use: No    Review of Systems Constitutional: No fever/chills Eyes: No visual changes. ENT: No sore throat.  Nasal congestion ear pain Cardiovascular: Denies chest pain. Respiratory: Denies shortness of breath. Gastrointestinal: No abdominal pain.  No nausea, no vomiting.  No diarrhea.  No constipation. Genitourinary: Negative for  dysuria. Musculoskeletal: Negative for back pain. Skin: Negative for rash. Neurological: Negative for headaches, focal weakness or numbness. Psychiatric:Anxiety Allergic/Immunilogical: See medication list. ____________________________________________   PHYSICAL EXAM:  VITAL SIGNS: ED Triage Vitals  Enc Vitals Group     BP 06/23/18 1128 107/75     Pulse Rate 06/23/18 1128 77     Resp 06/23/18 1128 18     Temp 06/23/18 1128 98 F (36.7 C)     Temp src --      SpO2 06/23/18 1128 99 %     Weight 06/23/18 1108 162 lb (73.5 kg)     Height 06/23/18 1108 5\' 1"  (1.549 m)     Head Circumference --      Peak Flow --      Pain Score 06/23/18 1107 8     Pain Loc --      Pain Edu? --      Excl. in GC? --     Constitutional: Alert and oriented. Well appearing and in no acute distress. Eyes: Conjunctivae are normal. PERRL. EOMI. Head: Atraumatic. Nose: Edematous nasal turbinates clear rhinorrhea.  Bilateral edematous TMs. Mouth/Throat: Mucous membranes are moist.  Oropharynx non-erythematous. Neck: No stridor.  Hematological/Lymphatic/Immunilogical: No cervical lymphadenopathy. Cardiovascular: Normal rate, regular rhythm. Grossly normal heart sounds.  Good peripheral circulation. Respiratory: Normal respiratory effort.  No retractions. Lungs CTAB. Skin:  Skin is warm, dry and intact. No rash noted. Psychiatric: Mood and affect are normal. Speech and behavior are normal.  ____________________________________________   LABS (  all labs ordered are listed, but only abnormal results are displayed)  Labs Reviewed - No data to display ____________________________________________  EKG   ____________________________________________  RADIOLOGY  ED MD interpretation:    Official radiology report(s): No results found.  ____________________________________________   PROCEDURES  Procedure(s) performed: None  Procedures  Critical Care performed:  No  ____________________________________________   INITIAL IMPRESSION / ASSESSMENT AND PLAN / ED COURSE  As part of my medical decision making, I reviewed the following data within the electronic MEDICAL RECORD NUMBER    Patient presents with bilateral ear pain mild hearing loss.  Patient also has nasal congestion.  Complaint consistent with station tube dysfunction.  Patient given discharge care instructions advised take medication as directed.  Patient given a work note advised [facility]      ____________________________________________   FINAL CLINICAL IMPRESSION(S) / ED DIAGNOSES  Final diagnoses:  Eustachian tube dysfunction, bilateral  Nasal congestion     ED Discharge Orders         Ordered    fexofenadine-pseudoephedrine (ALLEGRA-D) 60-120 MG 12 hr tablet  2 times daily     06/23/18 1202    traMADol (ULTRAM) 50 MG tablet  Every 12 hours PRN     06/23/18 1202           Note:  This document was prepared using Dragon voice recognition software and may include unintentional dictation errors.    Joni ReiningSmith, Ladeja Pelham K, PA-C 06/23/18 1207    Charlynne PanderYao, David Hsienta, MD 06/23/18 [phone]

## 2018-09-10 ENCOUNTER — Encounter: Payer: Self-pay | Admitting: Emergency Medicine

## 2019-05-18 ENCOUNTER — Other Ambulatory Visit: Payer: Self-pay

## 2019-05-18 ENCOUNTER — Emergency Department: Payer: Self-pay

## 2019-05-18 ENCOUNTER — Emergency Department
Admission: EM | Admit: 2019-05-18 | Discharge: 2019-05-18 | Disposition: A | Discharge status: Home / Self Care | Payer: Self-pay | Attending: Emergency Medicine | Admitting: Emergency Medicine

## 2019-05-18 ENCOUNTER — Encounter: Payer: Self-pay | Admitting: Emergency Medicine

## 2019-05-18 DIAGNOSIS — N390 Urinary tract infection, site not specified: Secondary | ICD-10-CM | POA: Insufficient documentation

## 2019-05-18 DIAGNOSIS — R059 Cough, unspecified: Secondary | ICD-10-CM

## 2019-05-18 DIAGNOSIS — Z79899 Other long term (current) drug therapy: Secondary | ICD-10-CM | POA: Insufficient documentation

## 2019-05-18 DIAGNOSIS — Z87891 Personal history of nicotine dependence: Secondary | ICD-10-CM | POA: Insufficient documentation

## 2019-05-18 DIAGNOSIS — H6981 Other specified disorders of Eustachian tube, right ear: Secondary | ICD-10-CM | POA: Insufficient documentation

## 2019-05-18 DIAGNOSIS — J40 Bronchitis, not specified as acute or chronic: Secondary | ICD-10-CM | POA: Insufficient documentation

## 2019-05-18 DIAGNOSIS — R05 Cough: Secondary | ICD-10-CM

## 2019-05-18 DIAGNOSIS — H9201 Otalgia, right ear: Secondary | ICD-10-CM

## 2019-05-18 LAB — URINALYSIS, COMPLETE (UACMP) WITH MICROSCOPIC
Bacteria, UA: NONE SEEN
Bilirubin Urine: NEGATIVE
Glucose, UA: NEGATIVE mg/dL
Hgb urine dipstick: NEGATIVE
Ketones, ur: NEGATIVE mg/dL
Nitrite: NEGATIVE
Protein, ur: NEGATIVE mg/dL
Specific Gravity, Urine: 1.013 (ref 1.005–1.030)
pH: 6 (ref 5.0–8.0)

## 2019-05-18 MED ORDER — HYDROCOD POLST-CPM POLST ER 10-8 MG/5ML PO SUER: 5.0000 mL | Freq: Two times a day (BID) | ORAL | 0 refills | Status: AC

## 2019-05-18 MED ORDER — HYDROCOD POLST-CPM POLST ER 10-8 MG/5ML PO SUER
5.0000 mL | Freq: Once | ORAL | Status: AC
  Administered 2019-05-18: 5 mL via ORAL
  Filled 2019-05-18: qty 5

## 2019-05-18 MED ORDER — PREDNISONE 20 MG PO TABS
60.0000 mg | ORAL_TABLET | Freq: Once | ORAL | Status: AC
  Administered 2019-05-18: 07:00:00 60 mg via ORAL
  Filled 2019-05-18: qty 3

## 2019-05-18 MED ORDER — FOSFOMYCIN TROMETHAMINE 3 G PO PACK
3.0000 g | PACK | Freq: Once | ORAL | Status: AC
  Administered 2019-05-18: 07:00:00 3 g via ORAL
  Filled 2019-05-18: qty 3

## 2019-05-18 MED ORDER — PREDNISONE 20 MG PO TABS: ORAL_TABLET | ORAL | 0 refills | Status: AC

## 2019-05-18 MED ORDER — ALBUTEROL SULFATE HFA 108 (90 BASE) MCG/ACT IN AERS: 2.0000 | INHALATION_SPRAY | RESPIRATORY_TRACT | 0 refills | Status: AC | PRN

## 2019-05-18 NOTE — Discharge Instructions (Signed)
1.  Take prednisone 60 mg daily x4 days. 2.  You may take Tussionex as needed for cough. 3.  Use albuterol inhaler 2 puffs every 4 hours as needed for cough/wheezing/difficulty breathing. 4.  Return to the ER for worsening symptoms, persistent vomiting, difficulty breathing or other concerns.

## 2019-05-18 NOTE — ED Triage Notes (Signed)
Patient ambulatory to triage with steady gait, without difficulty or distress noted, mask in place; reports since Saturday having sinus congestion, cough and rt earache; pt is also concerned that she may have a UTI and would like her urine checked because "everytime she coughs she pees on herself"

## 2019-05-18 NOTE — ED Provider Notes (Signed)
[facility] Emergency Department Provider Note   ____________________________________________   First MD Initiated Contact with Patient 05/18/19 [phone]     (approximate)  I have reviewed the triage vital signs and the nursing notes.   HISTORY  Chief Complaint Otalgia and Nasal Congestion    HPI [name] is a 43 y.o. female who presents to the ED from home with a chief complaint of sinus congestion, dry cough, right earache.  Concerned she may have a UTI because she pees every time she coughs.  Has been taking AZO since Friday.  Denies fever, chest pain, shortness of breath, abdominal pain, nausea, vomiting, diarrhea.  Denies exposure to persons diagnosed with coronavirus.       Past Medical History:  Diagnosis Date  . Anxiety   . Migraine   . Renal disorder    kidney stones    There are no active problems to display for this patient.   Past Surgical History:  Procedure Laterality Date  . ABDOMINAL HYSTERECTOMY    . CESAREAN SECTION    . CHOLECYSTECTOMY    . right ankle surgery      Prior to Admission medications   Medication Sig Start Date End Date Taking? Authorizing Provider  albuterol (VENTOLIN HFA) 108 (90 Base) MCG/ACT inhaler Inhale 2 puffs into the lungs every 4 (four) hours as needed for wheezing or shortness of breath. 05/18/19   Paulette Blanch, MD  chlorpheniramine-HYDROcodone (TUSSIONEX PENNKINETIC ER) 10-8 MG/5ML SUER Take 5 mLs by mouth 2 (two) times daily. 05/18/19   Paulette Blanch, MD  fexofenadine-pseudoephedrine (ALLEGRA-D) 60-120 MG 12 hr tablet Take 1 tablet by mouth 2 (two) times daily. 06/23/18   Sable Feil, PA-C  predniSONE (DELTASONE) 20 MG tablet 3 tablets daily x 4 days 05/18/19   Paulette Blanch, MD  traMADol (ULTRAM) 50 MG tablet Take 1 tablet (50 mg total) by mouth every 12 (twelve) hours as needed. 06/23/18   Sable Feil, PA-C    Allergies Bee venom, Amoxapine and related, Darvon [propoxyphene],  Amoxicillin, and Sulfa antibiotics  No family history on file.  Social History Social History   Tobacco Use  . Smoking status: Former Research scientist (life sciences)  . Smokeless tobacco: Never Used  Substance Use Topics  . Alcohol use: No  . Drug use: No    Review of Systems  Constitutional: No fever/chills Eyes: No visual changes. ENT: Positive for nasal congestion.  Positive for right earache.  No sore throat. Cardiovascular: Denies chest pain. Respiratory: Positive for cough.  Denies shortness of breath. Gastrointestinal: No abdominal pain.  No nausea, no vomiting.  No diarrhea.  No constipation. Genitourinary: Negative for dysuria. Musculoskeletal: Negative for back pain. Skin: Negative for rash. Neurological: Negative for headaches, focal weakness or numbness.   ____________________________________________   PHYSICAL EXAM:  VITAL SIGNS: ED Triage Vitals  Enc Vitals Group     BP 05/18/19 0544 126/69     Pulse Rate 05/18/19 0544 78     Resp 05/18/19 0544 16     Temp 05/18/19 0544 98.3 F (36.8 C)     Temp Source 05/18/19 0544 Oral     SpO2 05/18/19 0544 96 %     Weight 05/18/19 0546 175 lb (79.4 kg)     Height 05/18/19 0546 5\' 1"  (1.549 m)     Head Circumference --      Peak Flow --      Pain Score 05/18/19 0546 6     Pain Loc --  Pain Edu? --      Excl. in GC? --     Constitutional: Alert and oriented. Well appearing and in mild acute distress. Eyes: Conjunctivae are normal. PERRL. EOMI. Head: Atraumatic. Ears: Right TM with fluid. Nose: Congestion/rhinnorhea. Mouth/Throat: Mucous membranes are moist.  Oropharynx non-erythematous. Neck: No stridor.  Supple neck without meningismus. Cardiovascular: Normal rate, regular rhythm. Grossly normal heart sounds.  Good peripheral circulation. Respiratory: Normal respiratory effort.  No retractions. Lungs CTAB. Gastrointestinal: Soft and nontender. No distention. No abdominal bruits. No CVA tenderness. Musculoskeletal: No lower  extremity tenderness nor edema.  No joint effusions. Neurologic:  Normal speech and language. No gross focal neurologic deficits are appreciated. No gait instability. Skin:  Skin is warm, dry and intact. No rash noted.  No petechiae. Psychiatric: Mood and affect are normal. Speech and behavior are normal.  ____________________________________________   LABS (all labs ordered are listed, but only abnormal results are displayed)  Labs Reviewed  URINALYSIS, COMPLETE (UACMP) WITH MICROSCOPIC   ____________________________________________  EKG  None ____________________________________________  RADIOLOGY  ED MD interpretation: No acute cardiopulmonary process  Official radiology report(s): Dg Chest 1 View  Result Date: 05/18/2019 CLINICAL DATA:  Congestion EXAM: CHEST  1 VIEW COMPARISON:  None. FINDINGS: The heart size and mediastinal contours are within normal limits. Both lungs are clear. The visualized skeletal structures are unremarkable. IMPRESSION: No active disease. Electronically Signed   By: Katherine Mantlehristopher  Green M.D.   On: 05/18/2019 06:05    ____________________________________________   PROCEDURES  Procedure(s) performed (including Critical Care):  Procedures   ____________________________________________   INITIAL IMPRESSION / ASSESSMENT AND PLAN / ED COURSE  As part of my medical decision making, I reviewed the following data within the electronic MEDICAL RECORD NUMBER Nursing notes reviewed and incorporated, Labs reviewed, Radiograph reviewed, Notes from prior ED visits and [place] Controlled Substance Database     Particia NearingKathryn Clapp Bond was evaluated in Emergency Department on 05/18/2019 for the symptoms described in the history of present illness. She was evaluated in the context of the global COVID-19 pandemic, which necessitated consideration that the patient might be at risk for infection with the SARS-CoV-2 virus that causes COVID-19. Institutional protocols and  algorithms that pertain to the evaluation of patients at risk for COVID-19 are in a state of rapid change based on information released by regulatory bodies including the CDC and federal and state organizations. These policies and algorithms were followed during the patient's care in the ED.    43 year old female who presents with right ear pain, nasal congestion, dry cough and concerns for UTI.  Will obtain chest x-ray, urinalysis.  Start prednisone burst, Tussionex, albuterol inhaler.  Strict return precautions given.  Patient verbalizes understanding agrees with plan of care.      ____________________________________________   FINAL CLINICAL IMPRESSION(S) / ED DIAGNOSES  Final diagnoses:  Right ear pain  Bronchitis  Dysfunction of right eustachian tube     ED Discharge Orders         Ordered    predniSONE (DELTASONE) 20 MG tablet     05/18/19 0620    chlorpheniramine-HYDROcodone (TUSSIONEX PENNKINETIC ER) 10-8 MG/5ML SUER  2 times daily     05/18/19 0620    albuterol (VENTOLIN HFA) 108 (90 Base) MCG/ACT inhaler  Every 4 hours PRN     05/18/19 0620           Note:  This document was prepared using Dragon voice recognition software and may include unintentional dictation errors.   Dolores FrameSung,  Plymouth Sink, MD 05/18/19 [phone]

## 2019-05-24 ENCOUNTER — Emergency Department
Admission: EM | Admit: 2019-05-24 | Discharge: 2019-05-24 | Disposition: A | Discharge status: Home / Self Care | Payer: Self-pay | Attending: Student in an Organized Health Care Education/Training Program | Admitting: Student in an Organized Health Care Education/Training Program

## 2019-05-24 ENCOUNTER — Other Ambulatory Visit: Payer: Self-pay

## 2019-05-24 ENCOUNTER — Emergency Department: Payer: Self-pay

## 2019-05-24 ENCOUNTER — Encounter: Payer: Self-pay | Admitting: Emergency Medicine

## 2019-05-24 DIAGNOSIS — Z87891 Personal history of nicotine dependence: Secondary | ICD-10-CM | POA: Insufficient documentation

## 2019-05-24 DIAGNOSIS — J40 Bronchitis, not specified as acute or chronic: Secondary | ICD-10-CM | POA: Insufficient documentation

## 2019-05-24 DIAGNOSIS — J069 Acute upper respiratory infection, unspecified: Secondary | ICD-10-CM | POA: Insufficient documentation

## 2019-05-24 MED ORDER — DOXYCYCLINE HYCLATE 100 MG PO TABS: 100.0000 mg | ORAL_TABLET | Freq: Two times a day (BID) | ORAL | 0 refills | Status: AC

## 2019-05-24 MED ORDER — PREDNISONE 10 MG PO TABS: 10.0000 mg | ORAL_TABLET | Freq: Every day | ORAL | 0 refills | Status: AC

## 2019-05-24 NOTE — ED Notes (Signed)
Pt reports was seen here for the same a week ago and spent 150 dollars on medicine just to feel worse. Pt reports pain to her right ear, nasal congestion, cough and sinus pressure.

## 2019-05-24 NOTE — ED Triage Notes (Addendum)
PT states she was dx with bronchitis last week and finished meds with no relief. Pt has noted productive cough and nasal congestion, states ears are hurting as well.

## 2019-05-24 NOTE — ED Notes (Signed)
Pt verbalizes understanding of d/c instructions, medications and follow up 

## 2019-05-24 NOTE — ED Provider Notes (Signed)
[facility] Emergency Department Provider Note    First MD Initiated Contact with Patient 05/24/19 1250     (approximate)  I have reviewed the triage vital signs and the nursing notes.   HISTORY  Chief Complaint Cough    HPI [name] is a 43 y.o. female below listed past medical history as well as history of smoking and bronchitis presents to the ER for persistent cough productive in nature as well as earache and sore throat.  Was recently treated for bronchitis with steroids and felt some improvement after steroids but after that ended she started feeling worse.  Is having chills.  No nausea or vomiting.  No chest pain.  Denies any sick contacts.    Past Medical History:  Diagnosis Date   Anxiety    Migraine    Renal disorder    kidney stones   No family history on file. Past Surgical History:  Procedure Laterality Date   ABDOMINAL HYSTERECTOMY     CESAREAN SECTION     CHOLECYSTECTOMY     right ankle surgery     There are no active problems to display for this patient.     Prior to Admission medications   Medication Sig Start Date End Date Taking? Authorizing Provider  albuterol (VENTOLIN HFA) 108 (90 Base) MCG/ACT inhaler Inhale 2 puffs into the lungs every 4 (four) hours as needed for wheezing or shortness of breath. 05/18/19   Irean HongSung, Jade J, MD  chlorpheniramine-HYDROcodone (TUSSIONEX PENNKINETIC ER) 10-8 MG/5ML SUER Take 5 mLs by mouth 2 (two) times daily. 05/18/19   Irean HongSung, Jade J, MD  doxycycline (VIBRA-TABS) 100 MG tablet Take 1 tablet (100 mg total) by mouth 2 (two) times daily for 7 days. 05/24/19 05/31/19  Willy Eddyobinson, India Jolin, MD  fexofenadine-pseudoephedrine (ALLEGRA-D) 60-120 MG 12 hr tablet Take 1 tablet by mouth 2 (two) times daily. 06/23/18   Joni ReiningSmith, Ronald K, PA-C  predniSONE (DELTASONE) 10 MG tablet Take 1 tablet (10 mg total) by mouth daily. Day 1-2: Take 50 mg  ( 5 pills) Day 3-4 : Take 40 mg (4pills) Day 5-6:  Take 30 mg (3 pills) Day 7-8:  Take 20 mg (2 pills) Day 9:  Take 10mg  (1 pill) 05/24/19   Willy Eddyobinson, Eldwin Volkov, MD  predniSONE (DELTASONE) 20 MG tablet 3 tablets daily x 4 days 05/18/19   Irean HongSung, Jade J, MD  traMADol (ULTRAM) 50 MG tablet Take 1 tablet (50 mg total) by mouth every 12 (twelve) hours as needed. 06/23/18   Joni ReiningSmith, Ronald K, PA-C    Allergies Bee venom, Amoxapine and related, Darvon [propoxyphene], Amoxicillin, and Sulfa antibiotics    Social History Social History   Tobacco Use   Smoking status: Former Smoker   Smokeless tobacco: Never Used  Substance Use Topics   Alcohol use: No   Drug use: No    Review of Systems Patient denies headaches, rhinorrhea, blurry vision, numbness, shortness of breath, chest pain, edema, cough, abdominal pain, nausea, vomiting, diarrhea, dysuria, fevers, rashes or hallucinations unless otherwise stated above in HPI. ____________________________________________   PHYSICAL EXAM:  VITAL SIGNS: Vitals:   05/24/19 1213  BP: 115/82  Pulse: (!) 55  Resp: 16  Temp: 98.4 F (36.9 C)  SpO2: 97%    Constitutional: Alert and oriented.  Eyes: Conjunctivae are normal.  Head: Atraumatic. Nose: No congestion/rhinnorhea. Mouth/Throat: Mucous membranes are moist.  TM clear bilaterally, no erytema, no mastoid ttp.   Neck: No stridor. Painless ROM.  Cardiovascular: Normal rate, regular  rhythm. Grossly normal heart sounds.  Good peripheral circulation. Respiratory: Normal respiratory effort.  No retractions. Lungs with coarse BS throughout Gastrointestinal: Soft and nontender. No distention. No abdominal bruits. No CVA tenderness. Genitourinary:  Musculoskeletal: No lower extremity tenderness nor edema.  No joint effusions. Neurologic:  Normal speech and language. No gross focal neurologic deficits are appreciated. No facial droop Skin:  Skin is warm, dry and intact. No rash noted. Psychiatric: Mood and affect are normal. Speech and behavior are  normal.  ____________________________________________   LABS (all labs ordered are listed, but only abnormal results are displayed)  No results found for this or any previous visit (from the past 24 hour(s)). ____________________________________________ ____________________________________________  CHYIFOYDX  I personally reviewed all radiographic images ordered to evaluate for the above acute complaints and reviewed radiology reports and findings.  These findings were personally discussed with the patient.  Please see medical record for radiology report.  ____________________________________________   PROCEDURES  Procedure(s) performed:  Procedures    Critical Care performed: no ____________________________________________   INITIAL IMPRESSION / ASSESSMENT AND PLAN / ED COURSE  Pertinent labs & imaging results that were available during my care of the patient were reviewed by me and considered in my medical decision making (see chart for details).   DDX: Bronchitis, pneumonia, viral illness, URI  [name] is a 42 y.o. who presents to the ED with cough and URI symptoms as described above.  Symptoms are not improving and given duration and severity will cover with doxycycline.  She did have a response to prednisone but may have needed a longer taper.  Chest x-ray appears consistent with bronchitis.  She is not having any hypoxia or respiratory distress think she is appropriate for outpatient follow-up. Patient declines covid testing. Have discussed with the patient and available family all diagnostics and treatments performed thus far and all questions were answered to the best of my ability. The patient demonstrates understanding and agreement with plan.      The patient was evaluated in Emergency Department today for the symptoms described in the history of present illness. He/she was evaluated in the context of the global COVID-19 pandemic, which necessitated  consideration that the patient might be at risk for infection with the SARS-CoV-2 virus that causes COVID-19. Institutional protocols and algorithms that pertain to the evaluation of patients at risk for COVID-19 are in a state of rapid change based on information released by regulatory bodies including the CDC and federal and state organizations. These policies and algorithms were followed during the patient's care in the ED.  As part of my medical decision making, I reviewed the following data within the [place] notes reviewed and incorporated, Labs reviewed, notes from prior ED visits and [place] Controlled Substance Database   ____________________________________________   FINAL CLINICAL IMPRESSION(S) / ED DIAGNOSES  Final diagnoses:  Bronchitis  Upper respiratory tract infection, unspecified type      NEW MEDICATIONS STARTED DURING THIS VISIT:  New Prescriptions   DOXYCYCLINE (VIBRA-TABS) 100 MG TABLET    Take 1 tablet (100 mg total) by mouth 2 (two) times daily for 7 days.   PREDNISONE (DELTASONE) 10 MG TABLET    Take 1 tablet (10 mg total) by mouth daily. Day 1-2: Take 50 mg  ( 5 pills) Day 3-4 : Take 40 mg (4pills) Day 5-6: Take 30 mg (3 pills) Day 7-8:  Take 20 mg (2 pills) Day 9:  Take 10mg  (1 pill)  Note:  This document was prepared using Dragon voice recognition software and may include unintentional dictation errors.    Willy Eddy, MD 05/24/19 1318

## 2019-05-24 NOTE — ED Notes (Signed)
Spoke with MD Corky Downs about pt , no verbal orders given

## 2019-05-24 NOTE — ED Triage Notes (Signed)
First Nurse Note:  Arrives with c/o ear pain, sore throat, and congestion.  States recently seen through ED and diagnosed with bronchitis.  Sinus congestion noted, strong cough, and laryngitis.  No SOB/ DOE.  NAD

## 2019-11-29 ENCOUNTER — Emergency Department (HOSPITAL_COMMUNITY): Admission: EM | Admit: 2019-11-29 | Discharge: 2019-11-29 | Discharge status: Against Medical Advice | Payer: Self-pay

## 2019-11-29 ENCOUNTER — Other Ambulatory Visit: Payer: Self-pay

## 2019-11-29 ENCOUNTER — Ambulatory Visit: Payer: Self-pay

## 2019-11-29 ENCOUNTER — Other Ambulatory Visit: Payer: Self-pay | Admitting: Family Medicine

## 2019-11-29 DIAGNOSIS — M25562 Pain in left knee: Secondary | ICD-10-CM

## 2019-11-29 NOTE — ED Notes (Signed)
Pt states she isnt staying to wait over nine hours because she overheard someone state they were still waiting.

## 2020-10-28 IMAGING — DX DG KNEE COMPLETE 4+V*L*
4 series · 4 of 4 positions shown · non-contrast
Comparison: None.

CLINICAL DATA: Acute left knee pain after fall yesterday.

EXAM:
LEFT KNEE - COMPLETE 4+ VIEW

[knee pa]
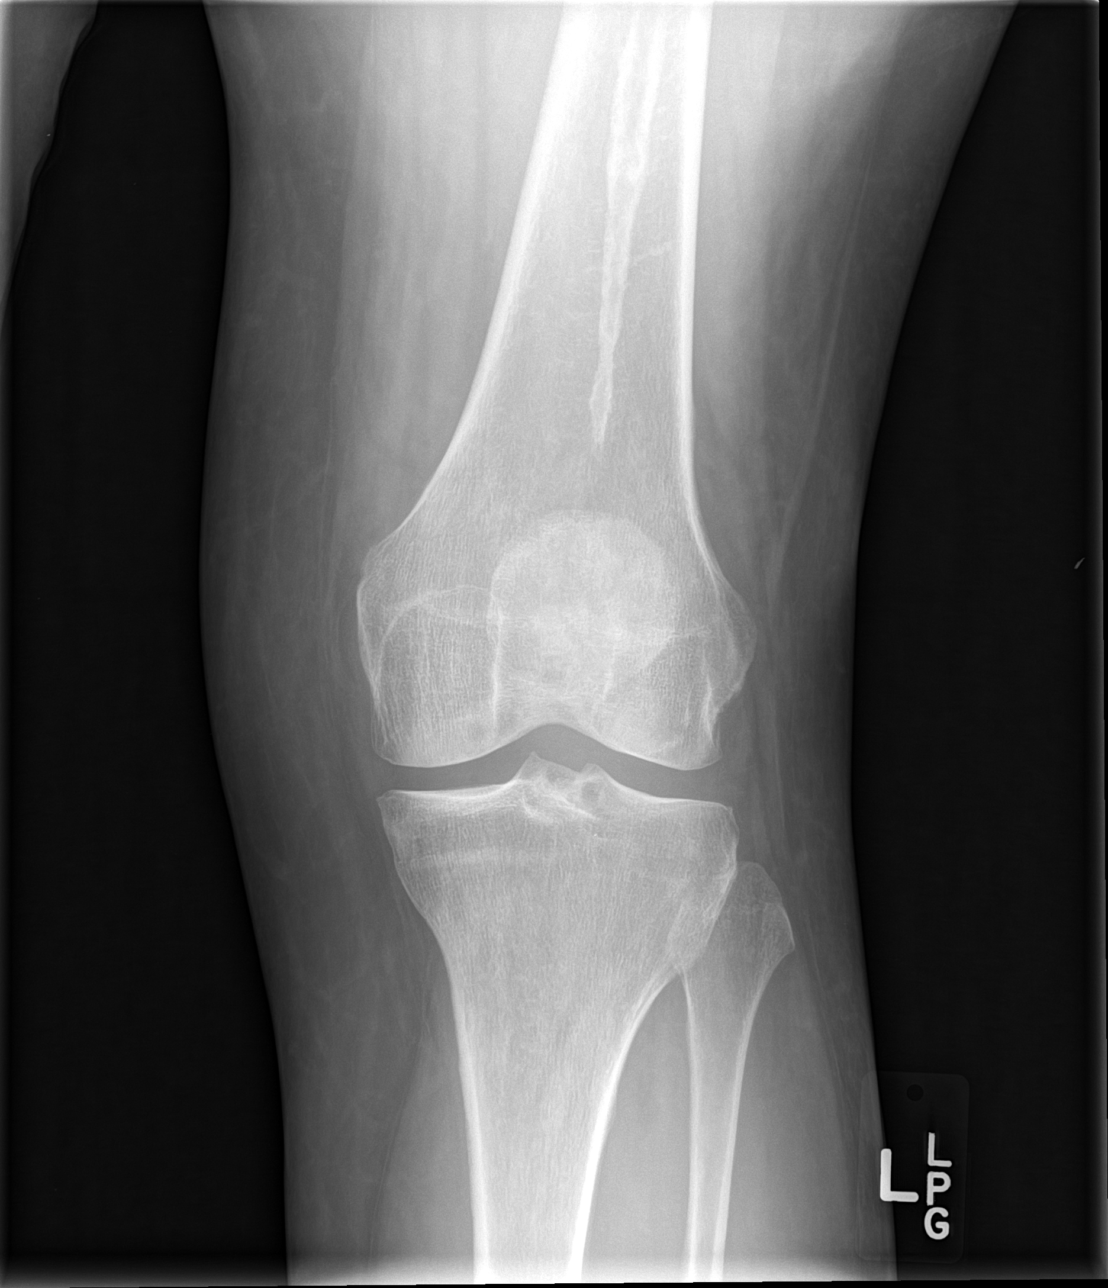

[knee obl (1 of 2)]
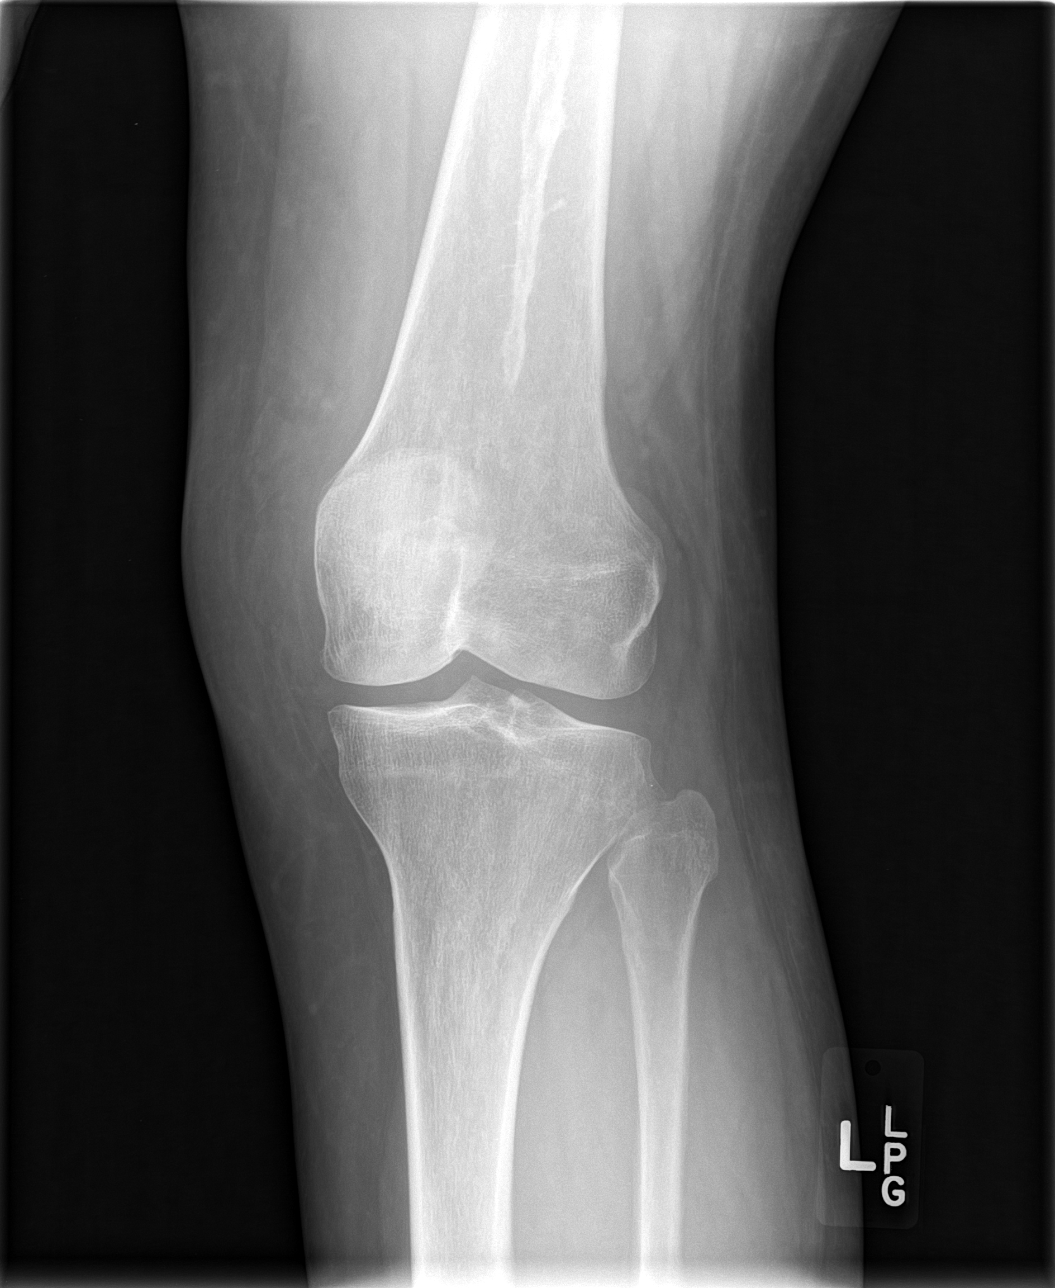

[knee obl (2 of 2)]
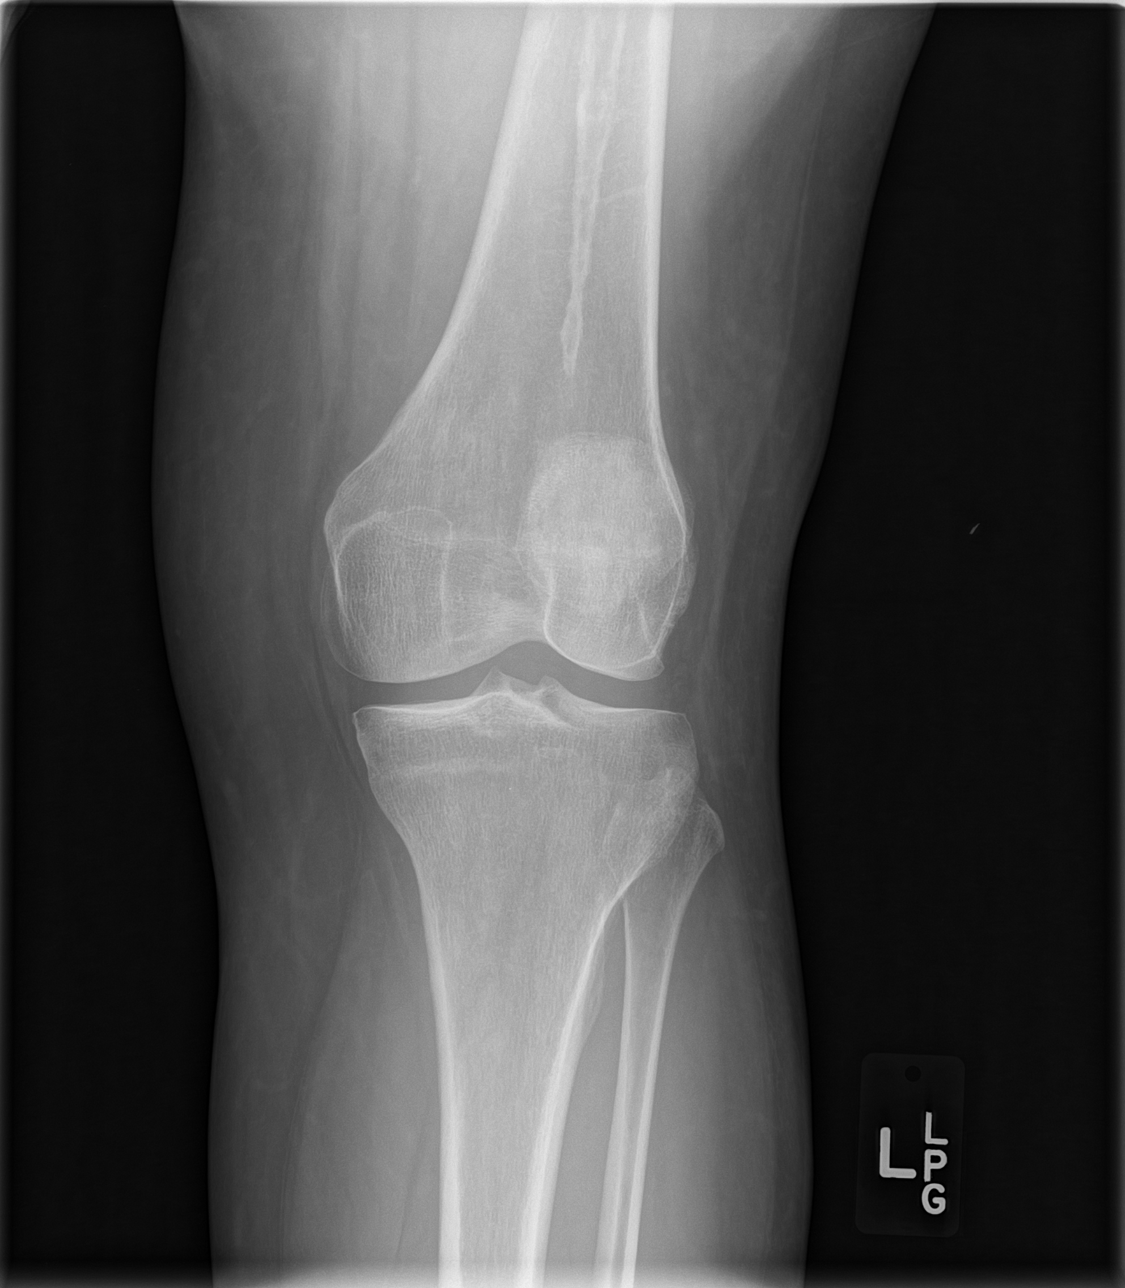

[knee lat]
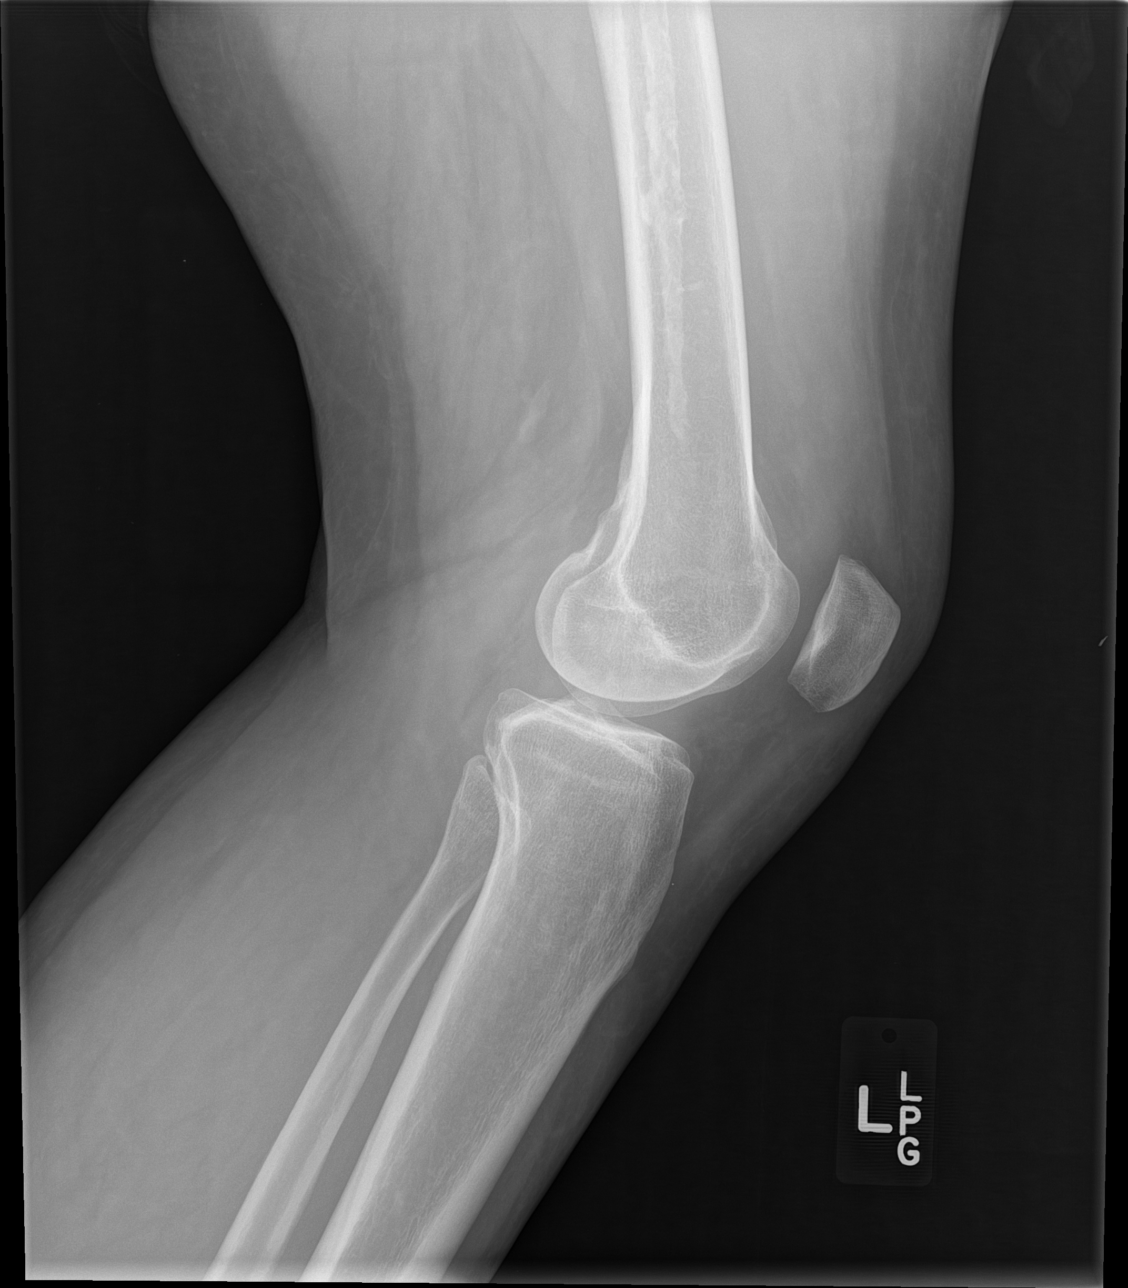

[4 of 4 positions shown; findings below may reference images not displayed]

FINDINGS: No evidence of fracture, dislocation, or joint effusion. No evidence
of arthropathy or other focal bone abnormality. Soft tissues are
unremarkable.
IMPRESSION: Negative.
